# Patient Record
Sex: Female | Born: 1955 | Race: White | Hispanic: No | Marital: Married | Smoking: Former smoker
Health system: Southern US, Community
[De-identification: ages and names within clinical notes are randomized; demographics above are authoritative.]

## PROBLEM LIST (undated history)

## (undated) DIAGNOSIS — K589 Irritable bowel syndrome without diarrhea: Secondary | ICD-10-CM

## (undated) DIAGNOSIS — C801 Malignant (primary) neoplasm, unspecified: Secondary | ICD-10-CM

## (undated) DIAGNOSIS — E7800 Pure hypercholesterolemia, unspecified: Secondary | ICD-10-CM

## (undated) DIAGNOSIS — K219 Gastro-esophageal reflux disease without esophagitis: Secondary | ICD-10-CM

## (undated) DIAGNOSIS — M810 Age-related osteoporosis without current pathological fracture: Secondary | ICD-10-CM

## (undated) DIAGNOSIS — F411 Generalized anxiety disorder: Secondary | ICD-10-CM

## (undated) DIAGNOSIS — M8580 Other specified disorders of bone density and structure, unspecified site: Secondary | ICD-10-CM

## (undated) DIAGNOSIS — F419 Anxiety disorder, unspecified: Secondary | ICD-10-CM

## (undated) DIAGNOSIS — E041 Nontoxic single thyroid nodule: Secondary | ICD-10-CM

## (undated) DIAGNOSIS — Z803 Family history of malignant neoplasm of breast: Secondary | ICD-10-CM

## (undated) DIAGNOSIS — E538 Deficiency of other specified B group vitamins: Secondary | ICD-10-CM

## (undated) DIAGNOSIS — G4700 Insomnia, unspecified: Secondary | ICD-10-CM

## (undated) DIAGNOSIS — I341 Nonrheumatic mitral (valve) prolapse: Secondary | ICD-10-CM

## (undated) DIAGNOSIS — C50919 Malignant neoplasm of unspecified site of unspecified female breast: Secondary | ICD-10-CM

## (undated) DIAGNOSIS — Z923 Personal history of irradiation: Secondary | ICD-10-CM

## (undated) HISTORY — DX: Generalized anxiety disorder: F41.1

## (undated) HISTORY — DX: Deficiency of other specified B group vitamins: E53.8

## (undated) HISTORY — DX: Anxiety disorder, unspecified: F41.9

## (undated) HISTORY — PX: APPENDECTOMY: SHX54

## (undated) HISTORY — PX: KNEE CARTILAGE SURGERY: SHX688

## (undated) HISTORY — DX: Other specified disorders of bone density and structure, unspecified site: M85.80

## (undated) HISTORY — DX: Malignant (primary) neoplasm, unspecified: C80.1

## (undated) HISTORY — DX: Pure hypercholesterolemia, unspecified: E78.00

## (undated) HISTORY — DX: Family history of malignant neoplasm of breast: Z80.3

## (undated) HISTORY — DX: Nonrheumatic mitral (valve) prolapse: I34.1

## (undated) HISTORY — PX: FOOT SURGERY: SHX648

## (undated) HISTORY — DX: Irritable bowel syndrome, unspecified: K58.9

## (undated) HISTORY — DX: Gastro-esophageal reflux disease without esophagitis: K21.9

## (undated) HISTORY — DX: Age-related osteoporosis without current pathological fracture: M81.0

## (undated) HISTORY — DX: Insomnia, unspecified: G47.00

## (undated) HISTORY — DX: Nontoxic single thyroid nodule: E04.1

---

## 1998-04-10 ENCOUNTER — Other Ambulatory Visit: Admission: RE | Admit: 1998-04-10 | Discharge: 1998-04-10 | Payer: Self-pay | Admitting: Obstetrics and Gynecology

## 1998-09-08 ENCOUNTER — Emergency Department (HOSPITAL_COMMUNITY): Admission: EM | Admit: 1998-09-08 | Discharge: 1998-09-08 | Payer: Self-pay | Admitting: Emergency Medicine

## 1998-09-20 ENCOUNTER — Ambulatory Visit (HOSPITAL_COMMUNITY): Admission: RE | Admit: 1998-09-20 | Discharge: 1998-09-20 | Payer: Self-pay | Admitting: Internal Medicine

## 1998-09-20 ENCOUNTER — Encounter: Payer: Self-pay | Admitting: Internal Medicine

## 1998-09-22 ENCOUNTER — Ambulatory Visit (HOSPITAL_COMMUNITY): Admission: RE | Admit: 1998-09-22 | Discharge: 1998-09-22 | Payer: Self-pay | Admitting: Internal Medicine

## 1998-09-22 ENCOUNTER — Encounter: Payer: Self-pay | Admitting: Internal Medicine

## 1999-05-07 ENCOUNTER — Other Ambulatory Visit: Admission: RE | Admit: 1999-05-07 | Discharge: 1999-05-07 | Payer: Self-pay | Admitting: Obstetrics and Gynecology

## 1999-06-19 ENCOUNTER — Other Ambulatory Visit: Admission: RE | Admit: 1999-06-19 | Discharge: 1999-06-19 | Payer: Self-pay | Admitting: Obstetrics and Gynecology

## 1999-06-19 ENCOUNTER — Encounter (INDEPENDENT_AMBULATORY_CARE_PROVIDER_SITE_OTHER): Payer: Self-pay | Admitting: Specialist

## 1999-09-06 ENCOUNTER — Encounter: Payer: Self-pay | Admitting: Internal Medicine

## 1999-09-06 ENCOUNTER — Inpatient Hospital Stay (HOSPITAL_COMMUNITY): Admission: RE | Admit: 1999-09-06 | Discharge: 1999-09-08 | Payer: Self-pay | Admitting: Internal Medicine

## 1999-09-06 ENCOUNTER — Encounter (INDEPENDENT_AMBULATORY_CARE_PROVIDER_SITE_OTHER): Payer: Self-pay | Admitting: Specialist

## 2000-01-08 ENCOUNTER — Other Ambulatory Visit: Admission: RE | Admit: 2000-01-08 | Discharge: 2000-01-08 | Payer: Self-pay | Admitting: Obstetrics and Gynecology

## 2000-03-05 ENCOUNTER — Other Ambulatory Visit: Admission: RE | Admit: 2000-03-05 | Discharge: 2000-03-05 | Payer: Self-pay | Admitting: Obstetrics and Gynecology

## 2000-03-05 ENCOUNTER — Encounter (INDEPENDENT_AMBULATORY_CARE_PROVIDER_SITE_OTHER): Payer: Self-pay

## 2000-04-23 ENCOUNTER — Encounter: Payer: Self-pay | Admitting: Internal Medicine

## 2000-12-30 ENCOUNTER — Other Ambulatory Visit: Admission: RE | Admit: 2000-12-30 | Discharge: 2000-12-30 | Payer: Self-pay | Admitting: Obstetrics and Gynecology

## 2001-06-12 ENCOUNTER — Other Ambulatory Visit: Admission: RE | Admit: 2001-06-12 | Discharge: 2001-06-12 | Payer: Self-pay | Admitting: Obstetrics and Gynecology

## 2001-12-08 ENCOUNTER — Other Ambulatory Visit: Admission: RE | Admit: 2001-12-08 | Discharge: 2001-12-08 | Payer: Self-pay | Admitting: Obstetrics and Gynecology

## 2001-12-11 ENCOUNTER — Encounter: Admission: RE | Admit: 2001-12-11 | Discharge: 2001-12-11 | Payer: Self-pay | Admitting: Obstetrics and Gynecology

## 2001-12-11 ENCOUNTER — Encounter: Payer: Self-pay | Admitting: Obstetrics and Gynecology

## 2002-01-19 ENCOUNTER — Other Ambulatory Visit: Admission: RE | Admit: 2002-01-19 | Discharge: 2002-01-19 | Payer: Self-pay | Admitting: General Surgery

## 2002-07-20 ENCOUNTER — Other Ambulatory Visit: Admission: RE | Admit: 2002-07-20 | Discharge: 2002-07-20 | Payer: Self-pay | Admitting: Obstetrics and Gynecology

## 2003-02-07 ENCOUNTER — Other Ambulatory Visit: Admission: RE | Admit: 2003-02-07 | Discharge: 2003-02-07 | Payer: Self-pay | Admitting: Obstetrics and Gynecology

## 2003-07-27 ENCOUNTER — Other Ambulatory Visit: Admission: RE | Admit: 2003-07-27 | Discharge: 2003-07-27 | Payer: Self-pay | Admitting: Obstetrics and Gynecology

## 2004-01-03 ENCOUNTER — Emergency Department (HOSPITAL_COMMUNITY): Admission: EM | Admit: 2004-01-03 | Discharge: 2004-01-03 | Payer: Self-pay | Admitting: Emergency Medicine

## 2004-02-16 ENCOUNTER — Other Ambulatory Visit: Admission: RE | Admit: 2004-02-16 | Discharge: 2004-02-16 | Payer: Self-pay | Admitting: Obstetrics and Gynecology

## 2004-02-23 ENCOUNTER — Encounter: Admission: RE | Admit: 2004-02-23 | Discharge: 2004-02-23 | Payer: Self-pay | Admitting: Obstetrics and Gynecology

## 2004-04-03 ENCOUNTER — Encounter: Admission: RE | Admit: 2004-04-03 | Discharge: 2004-04-03 | Payer: Self-pay | Admitting: Obstetrics and Gynecology

## 2005-03-13 ENCOUNTER — Other Ambulatory Visit: Admission: RE | Admit: 2005-03-13 | Discharge: 2005-03-13 | Payer: Self-pay | Admitting: Obstetrics and Gynecology

## 2005-03-25 ENCOUNTER — Ambulatory Visit: Payer: Self-pay | Admitting: Internal Medicine

## 2005-06-06 ENCOUNTER — Ambulatory Visit: Payer: Self-pay | Admitting: Internal Medicine

## 2005-07-22 ENCOUNTER — Encounter (INDEPENDENT_AMBULATORY_CARE_PROVIDER_SITE_OTHER): Payer: Self-pay | Admitting: *Deleted

## 2005-07-22 ENCOUNTER — Ambulatory Visit: Payer: Self-pay | Admitting: Internal Medicine

## 2007-02-10 ENCOUNTER — Ambulatory Visit: Payer: Self-pay | Admitting: Internal Medicine

## 2007-02-10 DIAGNOSIS — F4321 Adjustment disorder with depressed mood: Secondary | ICD-10-CM

## 2007-02-10 DIAGNOSIS — F329 Major depressive disorder, single episode, unspecified: Secondary | ICD-10-CM

## 2007-02-10 DIAGNOSIS — F32A Depression, unspecified: Secondary | ICD-10-CM | POA: Insufficient documentation

## 2007-02-10 HISTORY — DX: Adjustment disorder with depressed mood: F43.21

## 2007-02-11 LAB — CONVERTED CEMR LAB: TSH: 1.74 microintl units/mL (ref 0.35–5.50)

## 2007-12-23 DIAGNOSIS — K219 Gastro-esophageal reflux disease without esophagitis: Secondary | ICD-10-CM

## 2007-12-23 DIAGNOSIS — I059 Rheumatic mitral valve disease, unspecified: Secondary | ICD-10-CM

## 2007-12-23 DIAGNOSIS — D126 Benign neoplasm of colon, unspecified: Secondary | ICD-10-CM

## 2007-12-23 HISTORY — DX: Benign neoplasm of colon, unspecified: D12.6

## 2007-12-23 HISTORY — DX: Rheumatic mitral valve disease, unspecified: I05.9

## 2007-12-23 HISTORY — DX: Gastro-esophageal reflux disease without esophagitis: K21.9

## 2007-12-24 ENCOUNTER — Ambulatory Visit: Payer: Self-pay | Admitting: Internal Medicine

## 2007-12-24 DIAGNOSIS — R1013 Epigastric pain: Secondary | ICD-10-CM | POA: Insufficient documentation

## 2007-12-25 ENCOUNTER — Ambulatory Visit (HOSPITAL_COMMUNITY): Admission: RE | Admit: 2007-12-25 | Discharge: 2007-12-25 | Payer: Self-pay | Admitting: Internal Medicine

## 2007-12-25 LAB — CONVERTED CEMR LAB
AST: 20 units/L (ref 0–37)
Albumin: 4.2 g/dL (ref 3.5–5.2)
Alkaline Phosphatase: 45 units/L (ref 39–117)
Amylase: 63 units/L (ref 27–131)
BUN: 8 mg/dL (ref 6–23)
Basophils Relative: 0.9 % (ref 0.0–3.0)
Creatinine, Ser: 0.9 mg/dL (ref 0.4–1.2)
Eosinophils Absolute: 0.1 10*3/uL (ref 0.0–0.7)
Eosinophils Relative: 0.6 % (ref 0.0–5.0)
GFR calc Af Amer: 85 mL/min
Glucose, Bld: 90 mg/dL (ref 70–99)
HCT: 39.8 % (ref 36.0–46.0)
Hemoglobin: 14.1 g/dL (ref 12.0–15.0)
MCV: 95 fL (ref 78.0–100.0)
Monocytes Absolute: 0.6 10*3/uL (ref 0.1–1.0)
Monocytes Relative: 6.8 % (ref 3.0–12.0)
Platelets: 219 10*3/uL (ref 150–400)
Potassium: 4.1 meq/L (ref 3.5–5.1)
RBC: 4.19 M/uL (ref 3.87–5.11)
Total Protein: 6.8 g/dL (ref 6.0–8.3)
WBC: 8.9 10*3/uL (ref 4.5–10.5)

## 2008-03-04 DIAGNOSIS — C50912 Malignant neoplasm of unspecified site of left female breast: Secondary | ICD-10-CM | POA: Insufficient documentation

## 2008-03-04 DIAGNOSIS — C801 Malignant (primary) neoplasm, unspecified: Secondary | ICD-10-CM

## 2008-03-04 DIAGNOSIS — C50919 Malignant neoplasm of unspecified site of unspecified female breast: Secondary | ICD-10-CM

## 2008-03-04 DIAGNOSIS — Z923 Personal history of irradiation: Secondary | ICD-10-CM

## 2008-03-04 HISTORY — DX: Malignant neoplasm of unspecified site of unspecified female breast: C50.919

## 2008-03-04 HISTORY — DX: Malignant (primary) neoplasm, unspecified: C80.1

## 2008-03-04 HISTORY — DX: Personal history of irradiation: Z92.3

## 2008-03-04 HISTORY — DX: Malignant neoplasm of unspecified site of left female breast: C50.912

## 2008-07-08 ENCOUNTER — Encounter (INDEPENDENT_AMBULATORY_CARE_PROVIDER_SITE_OTHER): Payer: Self-pay | Admitting: Obstetrics and Gynecology

## 2008-07-08 ENCOUNTER — Encounter: Admission: RE | Admit: 2008-07-08 | Discharge: 2008-07-08 | Payer: Self-pay | Admitting: Obstetrics and Gynecology

## 2008-07-08 ENCOUNTER — Encounter (INDEPENDENT_AMBULATORY_CARE_PROVIDER_SITE_OTHER): Payer: Self-pay | Admitting: Diagnostic Radiology

## 2008-07-08 HISTORY — PX: BREAST BIOPSY: SHX20

## 2008-07-15 ENCOUNTER — Encounter: Admission: RE | Admit: 2008-07-15 | Discharge: 2008-07-15 | Payer: Self-pay | Admitting: Obstetrics and Gynecology

## 2008-07-19 ENCOUNTER — Ambulatory Visit: Payer: Self-pay | Admitting: Genetic Counselor

## 2008-08-26 ENCOUNTER — Encounter: Admission: RE | Admit: 2008-08-26 | Discharge: 2008-08-26 | Payer: Self-pay | Admitting: General Surgery

## 2008-08-29 ENCOUNTER — Encounter (INDEPENDENT_AMBULATORY_CARE_PROVIDER_SITE_OTHER): Payer: Self-pay | Admitting: General Surgery

## 2008-08-29 ENCOUNTER — Ambulatory Visit (HOSPITAL_BASED_OUTPATIENT_CLINIC_OR_DEPARTMENT_OTHER): Admission: RE | Admit: 2008-08-29 | Discharge: 2008-08-30 | Payer: Self-pay | Admitting: General Surgery

## 2008-08-29 ENCOUNTER — Encounter: Admission: RE | Admit: 2008-08-29 | Discharge: 2008-08-29 | Payer: Self-pay | Admitting: General Surgery

## 2008-08-29 HISTORY — PX: BREAST LUMPECTOMY: SHX2

## 2008-08-29 HISTORY — PX: REDUCTION MAMMAPLASTY: SUR839

## 2008-09-13 ENCOUNTER — Ambulatory Visit: Admission: RE | Admit: 2008-09-13 | Discharge: 2008-11-24 | Payer: Self-pay | Admitting: Radiation Oncology

## 2008-09-15 ENCOUNTER — Encounter: Payer: Self-pay | Admitting: Internal Medicine

## 2008-09-15 ENCOUNTER — Ambulatory Visit: Payer: Self-pay | Admitting: Oncology

## 2008-11-15 ENCOUNTER — Ambulatory Visit: Payer: Self-pay | Admitting: Oncology

## 2009-06-16 ENCOUNTER — Ambulatory Visit: Payer: Self-pay | Admitting: Internal Medicine

## 2009-07-06 ENCOUNTER — Telehealth: Payer: Self-pay | Admitting: Internal Medicine

## 2009-07-06 ENCOUNTER — Emergency Department (HOSPITAL_COMMUNITY): Admission: EM | Admit: 2009-07-06 | Discharge: 2009-07-06 | Payer: Self-pay | Admitting: Emergency Medicine

## 2009-07-13 ENCOUNTER — Encounter: Admission: RE | Admit: 2009-07-13 | Discharge: 2009-07-13 | Payer: Self-pay | Admitting: Obstetrics and Gynecology

## 2009-09-13 ENCOUNTER — Telehealth (INDEPENDENT_AMBULATORY_CARE_PROVIDER_SITE_OTHER): Payer: Self-pay | Admitting: *Deleted

## 2010-04-03 NOTE — Progress Notes (Signed)
Summary: citalopram not helping//HOP SEE  Phone Note Call from Patient   Caller: Mom  ANN ADAMS Summary of Call: Seen 06/16/09 on Citalopram 20mg ; pt showing increased signs of depression ; --Pt mentioned to her son today "I dont feel like being on this planet anymore" --Mother concerned says I lost my sisiter to suicide.  RECOMMEND MOTHER TO TAKE PT TO ED, mother agreed will go to [facility] ED mother cell # is [phone] home # [phone] .Kandice Hams  Jul 06, 2009 4:04 PM   Initial call taken by: Kandice Hams,  Jul 06, 2009 4:04 PM  Follow-up for Phone Call        ACT Team evluation  is appropriate Follow-up by: Marga Melnick MD,  Jul 06, 2009 4:09 PM

## 2010-04-03 NOTE — Progress Notes (Signed)
Summary: NEEDS CITALOPRAM--NEEDS 90 DAY, NOT 30  Phone Note Call from Patient Call back at [facility] Phone [phone]   Caller: Patient Summary of Call: PATIENT NEEDS REFILL FOR CITALOPRAM ----SHE SAYS SHE WAS TOLD THAT, WHEN SHE CALLED FOR A REFILL, SHE COULD REQUEST A 90 DAY SUPPLY WITH REFILLS INSTEAD OF A 30 DAY SUPPLY  PLEASE CALL IT IN TO THE [facility] ON [address], [place] Initial call taken by: Jerolyn Shin,  September 13, 2009 11:38 AM    Prescriptions: CITALOPRAM HYDROBROMIDE 20 MG TABS (CITALOPRAM HYDROBROMIDE) 1 once daily  #90 x 1   Entered by:   Shonna Chock CMA   Authorized by:   Marga Melnick MD   Signed by:   Shonna Chock CMA on 09/13/2009   Method used:   Electronically to        [facility] (retail)       [address]       [place], [place]  [address]       Ph: [phone]       Fax: [phone]   RxID:   [id number]

## 2010-04-03 NOTE — Assessment & Plan Note (Signed)
Summary: Depression/scm   Vital Signs:  Patient profile:   55 year old female Weight:      117 pounds Temp:     98.1 degrees F oral Pulse rate:   76 / minute Resp:     15 per minute BP sitting:   102 / 72  (left arm)  Vitals Entered By: Jeremy Johann CMA (June 16, 2009 3:04 PM) CC: discuss depression Comments REVIEWED MED LIST, PATIENT AGREED DOSE AND INSTRUCTION CORRECT    Primary Care Provider:  Marga Melnick, MD  CC:  discuss depression.  History of Present Illness: She describes anxiety , sadness & anhedondia. Major stresses are son, breast cancer & menopause. She is seeing Colen Darling & had gone to [place].  Allergies (verified): No Known Drug Allergies  Review of Systems General:  Complains of fatigue and sleep disorder; denies loss of appetite and weight loss. Psych:  Complains of anxiety, depression, easily angered, easily tearful, irritability, and suicidal thoughts/plans; denies panic attacks; No concrete thought of suicide; "it crossed my mind".  Physical Exam  General:  Thin,well-nourished; alert,appropriate and cooperative throughout examination Eyes:  No corneal or conjunctival inflammation noted. Perrla.No lid lag Neck:  No deformities, masses, or tenderness noted. Heart:  Normal rate and regular rhythm. S1 and S2 normal without gallop, murmur, click, rub.S4 Neurologic:  alert & oriented X3 and DTRs symmetrical and normal.  No tremor Skin:  Intact without suspicious lesions or rashes Psych:  memory intact for recent and remote, normally interactive, and  slightful tearful.     Impression & Recommendations:  Problem # 1:  DEPRESSIVE DISORDER (ICD-311)  Orders: Venipuncture (16109) TLB-TSH (Thyroid Stimulating Hormone) (84443-TSH)  Her updated medication list for this problem includes:    Citalopram Hydrobromide 20 Mg Tabs (Citalopram hydrobromide) .Marland Kitchen... 1 once daily  Complete Medication List: 1)  Prilosec 20 Mg Cpdr (Omeprazole) .Marland Kitchen.. 1 capsule by  mouth once daily 2)  Calcium Carbonate-vitamin D 600-400 Mg-unit Tabs (Calcium carbonate-vitamin d) .Marland Kitchen.. 1 tablet by mouth two times a day 3)  Citalopram Hydrobromide 20 Mg Tabs (Citalopram hydrobromide) .Marland Kitchen.. 1 once daily  Patient Instructions: 1)  Please schedule a follow-up appointment in 2 months. Prescriptions: CITALOPRAM HYDROBROMIDE 20 MG TABS (CITALOPRAM HYDROBROMIDE) 1 once daily  #30 x 5   Entered and Authorized by:   Marga Melnick MD   Signed by:   Marga Melnick MD on 06/16/2009   Method used:   Print then Give to Patient   RxID:   [id number]

## 2010-05-22 LAB — TRICYCLICS SCREEN, URINE: TCA Scrn: NOT DETECTED

## 2010-05-22 LAB — RAPID URINE DRUG SCREEN, HOSP PERFORMED
Benzodiazepines: POSITIVE — AB
Cocaine: NOT DETECTED
Tetrahydrocannabinol: NOT DETECTED

## 2010-06-11 ENCOUNTER — Other Ambulatory Visit: Payer: Self-pay | Admitting: Internal Medicine

## 2010-06-11 LAB — CBC
MCHC: 33.9 g/dL (ref 30.0–36.0)
MCV: 95.9 fL (ref 78.0–100.0)
Platelets: 214 10*3/uL (ref 150–400)
RDW: 12.9 % (ref 11.5–15.5)

## 2010-06-11 LAB — DIFFERENTIAL
Eosinophils Relative: 0 % (ref 0–5)
Lymphocytes Relative: 25 % (ref 12–46)
Lymphs Abs: 1.4 10*3/uL (ref 0.7–4.0)

## 2010-06-11 LAB — COMPREHENSIVE METABOLIC PANEL
AST: 23 U/L (ref 0–37)
Albumin: 4.2 g/dL (ref 3.5–5.2)
CO2: 24 mEq/L (ref 19–32)
Calcium: 9.1 mg/dL (ref 8.4–10.5)
Creatinine, Ser: 0.8 mg/dL (ref 0.4–1.2)
GFR calc Af Amer: >60 mL/min (ref >60)
GFR calc non Af Amer: >60 mL/min (ref >60)
Total Protein: 6.7 g/dL (ref 6.0–8.3)

## 2010-06-11 LAB — URINALYSIS, ROUTINE W REFLEX MICROSCOPIC
Bilirubin Urine: NEGATIVE
Ketones, ur: NEGATIVE mg/dL
Nitrite: NEGATIVE
Protein, ur: NEGATIVE mg/dL
Urobilinogen, UA: 1 mg/dL (ref 0.0–1.0)

## 2010-06-11 LAB — LACTATE DEHYDROGENASE: LDH: 152 U/L (ref 94–250)

## 2010-07-13 ENCOUNTER — Encounter (INDEPENDENT_AMBULATORY_CARE_PROVIDER_SITE_OTHER): Payer: Self-pay | Admitting: General Surgery

## 2010-07-17 NOTE — Op Note (Signed)
NAME:  Danielle Chavez, Danielle Chavez NO.:  [id number]   MEDICAL RECORD NO.:  [id number]          PATIENT TYPE:  AMB   LOCATION:  DSC                          FACILITY:  [facility]   PHYSICIAN:  Loreta Ave, MD DATE OF BIRTH:  08-11-1955   DATE OF PROCEDURE:  08/29/2008  DATE OF DISCHARGE:                               OPERATIVE REPORT   PREOPERATIVE DIAGNOSIS:  Left-sided ductal carcinoma in situ.   POSTOPERATIVE DIAGNOSIS:  Left-sided ductal carcinoma in situ.   PROCEDURE PERFORMED:  Dr. Claud Kelp performed a left lumpectomy and  a left axillary sentinel lymph node biopsy.  Dr. Noelle Penner performed  bilateral, differential mastopexy.  Please note that Dr. Jacinto Halim  portion of the operation will be dictated elsewhere.   SURGEON:  Loreta Ave, MD   ASSISTANT:  None.   ESTIMATED BLOOD LOSS:  20 mL.   DRAIN:  None.   COMPLICATIONS:  None.   DISPOSITION:  Stable to the recovery room.   CLINICAL INDICATION:  Danielle Chavez is a 55 year old female with high-grade  ductal carcinoma in situ of the left breast.  She is an excellent  candidate for lumpectomy with postoperative radiation and sentinel lymph  node biopsy.  However, she is relatively thin and desires breast  symmetry at the end of her oncologic procedure.  For this reason, she  was considered for oncoplasty procedure namely differential mastopexy.  [name] is aware of the risks of surgery include but are not limited  to bleeding, infection, breast asymmetry, loss of nipple sensation,  scarring, and the need for more surgery and [name] desires to  proceed.   DESCRIPTION OF THE OPERATION:  The patient was brought to the operating  room and placed in the supine position on the operating room table.  Preoperatively, she had been marked in the standard fashion for  mastopexy.  Her clavicles and sternal notch were outlined as were the  breast meridian.  A heavy batwing excision was planned on the left  breast.  This was designed around her lumpectomy defect in the lower  outer quadrant of her left breast.  This ellipse was approximately 3 cm  x 8 cm with the long axis being from the edge of the nipple running  laterally and slightly inferiorly.  The other portion of the batwing was  designed as a crescent excision above the nipple to elevate the nipple  1.5 cm.  Near an inch of that was transferred to the opposite breast.  The resection was planned to be slightly larger on the right than on the  left because the right breast was larger preoperatively and she desires  correction for symmetry.  After smooth and routine induction of general  anesthesia with laryngeal mask airway, the patient's chest was prepped  and draped into a sterile field.  At this point, Dr. Derrell Lolling performed a  left lumpectomy and sentinel lymph node biopsy and these will be  dictated elsewhere.  While Dr. Derrell Lolling was working on the left  lumpectomy, I performed a similar excision of the right outer and lower  quadrant.  At the end of excision, Dr. Jacinto Halim excision was 84 g on the  left and mine was 89 g on the right.  The crescent of tissue above the  areola on the right was excised and de-epithelialized with a 15 blade.  Next, the margins of the resection on the right were undermined above  the pectoralis muscle.  Next, the breast tissue was brought together in  layers with 2-0 Vicryl sutures.  Next, skin on the right was closed with  interrupted 3-0 buried Monocryl sutures in the dermis and running 4-0  subcuticular Monocryl stitch.  Attention was then turned to the left  breast after Dr. Derrell Lolling had completed his excision and sentinel lymph  node biopsy.   The left breast pocket was irrigated and hemostasis was verified with  electrocautery.  The breast tissue was undermined both superiorly and  inferiorly from the resection above the pectoralis muscle.  The breast  tissue was brought together with interrupted 2-0  Vicryl sutures to  obliterate all dead space.  Next, the skin was closed with 3-0 buried  interrupted dermal sutures and a running 4-0 subcuticular Monocryl  stitch.  Sponge and needle counts reported as correct.  The patient was  extubated and transported to the recovery room in stable condition.      Loreta Ave, MD  Electronically Signed     CF/MEDQ  D:  08/29/2008  T:  08/30/2008  Job:  [id number]

## 2010-07-17 NOTE — Op Note (Signed)
NAME:  Danielle Chavez, CARBARY                  ACCOUNT NO.:  [id number]   MEDICAL RECORD NO.:  [id number]          PATIENT TYPE:  AMB   LOCATION:  DSC                          FACILITY:  [facility]   PHYSICIAN:  Angelia Mould. Derrell Lolling, M.D.DATE OF BIRTH:  1956-01-21   DATE OF PROCEDURE:  08/29/2008  DATE OF DISCHARGE:                               OPERATIVE REPORT   PREOPERATIVE DIAGNOSIS:  High-grade ductal carcinoma in situ, left  breast, lower outer quadrant.   POSTOPERATIVE DIAGNOSIS:  High-grade ductal carcinoma in situ, left  breast, lower outer quadrant.   OPERATION PERFORMED:  1. Inject blue dye, left breast.  2. Left partial mastectomy with needle localization.  3. Left axillary sentinel node mapping and biopsy.   SURGEON:  Angelia Mould. Derrell Lolling, MD   OPERATIVE INDICATIONS:  This is a 55 year old Caucasian female who has  had no prior breast problems.  Routine mammograms recently showed a  small focus of microcalcifications in the left breast at 4 the o'clock  position.  Stereotactic biopsy was performed and showed a small focus of  high-grade ductal carcinoma in situ.  She has had an MRI which shows a  10-mm area of ductal carcinoma in situ at the 3:30 position of the left  breast near the biopsy marker clip.  This was a solitary finding.  She  has had genetic counseling and genetic testing and all of her markers  were negative.  She was interested in breast conservation surgery  because her right breast is larger than her left.  She was concerned  about worsening of the asymmetry.  I involved Dr. Loreta Ave and  we discussed the plan of care to perform oncoplastic surgery on both  breasts.  This was proposed to the patient and she is in full agreement.  We elected to do sentinel node biopsy because of the high-grade features  and the lateral nature of the tumor.   OPERATIVE TECHNIQUE:  The patient underwent needle localization by Dr.  Cain Saupe and the wire localization was  very good.  She was  brought to [facility] and underwent injection of  radionuclide by the nuclear medicine technician.   Dr. Noelle Penner and I sat her up in the holding area and marked bilateral  hemi-batwing incisions basically with a superiorly-oriented crescent  above the nipples to raise the nipples and bilateral radial ellipses at  about 3:30 position.  On the left, this encompassed the wire insertion  site.  The patient was then taken to the operating room and underwent  general anesthesia.  Following alcohol prep, I injected 5 mL of blue dye  in the left retroareolar area.  This was 2 mL of methylene blue mixed  with 3 mL of saline.  We massaged the breast for 5 minutes.  We remarked  the breast and then prepped and draped the neck and breast and axilla  and chest wall bilaterally.  We identified the patient as correct  patient, correct procedure, and correct side for all of the surgery.  Marcaine 0.5% with epinephrine was used as local infiltration  anesthetic.   The first procedure that was performed was the left partial mastectomy.  I made an incision through the marked radial ellipse at the 3:30  position on the left side.  Dissection was carried down widely around  the localizing marker.  The specimen was removed and weighed and marked  with a 6-color margin marker kit.  This specimen was sent to Dr. Deboraha Sprang  at the [facility] [place] and she said that it looked very good  with the marker clip in the center of the specimen.  The left breast  specimen was then sent to pathology.  The left breast wound was  irrigated with saline.  Hemostasis was excellent and achieved with  electrocautery.  This wound was packed open.   At this point in time, Dr. Noelle Penner started working on the right breast to  perform the hemi-batwing reduction on the right and he will dictate that  separately.   I then used the NeoProbe to isolate the area of radioactivity in the  left  axilla.  A transverse incision was made in the left axilla  overlying this area just slightly above the hairline.  Dissection was  carried down through the subcutaneous tissue and through the  clavipectoral fascia.  I isolated 2 very hot, very blue lymph nodes and  sent them both for routine histology.  Hemostasis was excellent.  The  wound was irrigated with saline.  The clavipectoral fascia was closed  with interrupted sutures of 3-0 Vicryl and skin was closed with a  running subcuticular suture of 4-0 Monocryl.   At this point of the case, I scrubbed out to speak with the family.  Dr.  Noelle Penner will complete the mastopexies and will place the bandages.  At  this point, estimated blood loss was about 30 mL or less.  Complications  none.  Sponge, needle, and instrument counts were correct.      Angelia Mould. Derrell Lolling, M.D.  Electronically Signed     HMI/MEDQ  D:  08/29/2008  T:  08/30/2008  Job:  [id number]   cc:   Juluis Mire, M.D.  Daryl Eastern, M.D.  Loreta Ave, MD  Lurline Hare, MD

## 2010-07-20 NOTE — H&P (Signed)
[facility]  Patient:    Danielle Chavez, Danielle Chavez                 MRN: [id number] Adm. Date:  09/06/99 Attending:  Angelia Mould. Derrell Lolling, M.D. CC:         Wilhemina Bonito. Eda Keys., M.D. [facility]             Titus Dubin. Alwyn Ren, M.D. [facility]                         History and Physical  CHIEF COMPLAINT:  Abdominal pain, nausea, and vomiting.  HISTORY OF PRESENT ILLNESS:  This is a 55 year old white female who has a 1 1/2-year history of intermittent episodes of abdominal pain. A typical attack is characterized by onset of epigastric pain, followed by nausea and vomiting repeatedly. This will last about three days and then completely resolve. She will occasionally but not always have diarrhea. Denies fever, chills, or abdominal distention.  She had been evaluated by Titus Dubin. Alwyn Ren, M.D. on several occasions. Gallbladder ultrasound is negative. Oral cholecystogram is negative. CBC and complete metabolic panels have been negative.  A similar attack began this morning with epigastric pain, nausea, and vomiting. She saw Wilhemina Bonito. Eda Keys., M.D. A CBC this time showed a white count of 11,100 with a left shift. A complete metabolic panel, lipase, and urinalysis are normal. She was sent for a CT scan which shows a slightly thickened gastric antrum suggesting the possibility of gastritis or poor filling. The appendix is noted to be retrocecal with some soft tissue stranding around the appendix suggesting inflammation. There is no abscess or sign of perforation. I was asked to evaluate the patient because of her recurrent abdominal pain and abnormal CT scan findings.  PAST MEDICAL HISTORY:  She has had left knee surgery for cartilage removal in the past. She has a history of mitral valve prolapse and takes antibiotics when she gets dental work.  CURRENT MEDICATIONS: 1. Prevacid one tablet daily. 2. Xanax p.r.n.  DRUG ALLERGIES:  CODEINE and DEMEROL cause nausea.  FAMILY  HISTORY:  Mother living age 39 has asthma, hypertension, and thyroid problems. Father died age 19 of transfusion-related AIDS. He had upper GI bleed from peptic ulcer disease. He had hypertension. Sister living, has had a cholecystectomy.  SOCIAL HISTORY:  The patient is divorced. Has two children. Denies the use of tobacco and drinks alcohol rarely. She is self-employed and sells womens clothes.  PHYSICAL EXAMINATION:  GENERAL:  A pleasant, thin, white female in mild distress.  VITAL SIGNS:  Temperature 97.8, respiratory rate 16, heart rate 90, blood pressure 107/56.  HEENT:  Sclerae clear. Extraocular movements intact. Oropharynx clear.  NECK:  Supple, nontender. No mass. No adenopathy. No bruits.  LUNGS:  Clear to auscultation.  HEART:  Regular rate and rhythm without murmur. I do not hear a click.  ABDOMEN:  Soft, not distended. Bowel sounds are active. She does have some tenderness in the right lower quadrant which is more subjective than objective. She really has minimal guarding and certainly there is no rebound or mass. The epigastrium is nontender. The right upper quadrant is nontender.  RECTAL:  Deferred because she has been repeatedly heme negative in the past.  EXTREMITIES:  No edema. Good pulses.  NEUROLOGICAL:  Grossly within normal limits.  IMPRESSION: 1. Probable appendicitis. History, physical findings, and CT scan findings are    consistent with a low-grade smoldering retrocecal  appendicitis. Gastritis    or other gastric pathology is possible but less likely. 2. Mitral valve prolapse.  PLAN:  I feel that the most prudent course is to proceed with diagnostic laparoscopy and laparoscopic appendectomy. If this is negative, consider upper endoscopy tomorrow. I have discussed the indications and details of the surgery with the patient and her mother. Risks and complications were outlined, including but not limited to bleeding, infection, injury to  adjacent organs, conversion to open laparotomy, and other unforeseen complications. They seem to understand these issues well. At this time, all their questions are answered. They agree with this plan. We will take her to the operating room tonight for diagnostic laparoscopy. She will be given ampicillin and gentamicin to cover her due to her history of mitral valve prolapse.DD: 09/06/99 TD:  09/06/99 Job: [id number]

## 2010-07-20 NOTE — Discharge Summary (Signed)
[facility]  Patient:    Danielle Chavez, ENSEY                 MRN: [id number] Adm. Date:  [id number] Disc. Date: [id number] Attending:  Brandy Hale CC:         Wilhemina Bonito. Eda Keys., M.D. [facility]  Titus Dubin. Alwyn Ren, M.D. [facility]   Discharge Summary  FINAL DIAGNOSIS:  Acute appendicitis.  OPERATIONS PERFORMED:  Laparoscopic appendectomy.  HISTORY OF PRESENT ILLNESS:  This a 55 year old white female with a 1 1/2 year history of intermittent episodes of epigastric pain, nausea, and vomiting with complete resolution after 2 or 3 days.  She has had an extensive workup, including ultrasound, ______ cholecystogram and blood work which has been unremarkable.  She had the onset of a similar attack on the morning of admission.  She saw Dr. Wilhemina Bonito. Eda Keys. where a CBC showed a white blood cell count elevated slightly to 11,100.  Normal liver function test, normal lipase, and a normal urinalysis.  A CT scan was then obtained which showed a slightly thickened gastric antrum and a retrocecal appendix with some evidence of periappendiceal inflammation.  I was asked to evaluate her at this point.  PHYSICAL EXAMINATION:  GENERAL:  This is a pleasant white female in mild to moderate distress.  VITAL SIGNS:  Temperature 98.7, heart rate 90, blood pressure 107/56.  LUNGS:  Clear.  ABDOMEN:  Soft, tender in the right lower quadrant, but no rebound or mass. Nontender epigastrium.  HOSPITAL COURSE: Considering the history and physical findings and CT scan findings, I felt that the patient should be taken to the operating room for a laparoscopy.  That was done, and we found that she had an acutely inflamed appendix.  A laparoscopic appendectomy was performed.  We took a look at the liver, gallbladder, stomach, and duodenum and saw no abnormality.  Postoperatively, the patient did very well.  She progressed her diet and activities over the next 24-36 hours and was  discharged home on September 08, 1999.  CONDITION ON DISCHARGE: At that time, she was afebrile, tolerating diet, abdomen was soft, and wounds looked good.  FOLLOW-UP: She was asked to follow-up with me in the office in 7-10 days.  DISCHARGE MEDICATIONS: She was given a prescription for Vicodin for pain. DD:  10/02/99 TD:  10/02/99 Job: [id number]

## 2010-09-13 ENCOUNTER — Other Ambulatory Visit: Payer: Self-pay | Admitting: Internal Medicine

## 2010-10-31 ENCOUNTER — Ambulatory Visit (INDEPENDENT_AMBULATORY_CARE_PROVIDER_SITE_OTHER): Payer: BC Managed Care – PPO | Admitting: Internal Medicine

## 2010-10-31 ENCOUNTER — Encounter: Payer: Self-pay | Admitting: Internal Medicine

## 2010-10-31 VITALS — BP 112/74 | HR 76 | Ht 65.25 in | Wt 117.0 lb

## 2010-10-31 DIAGNOSIS — F329 Major depressive disorder, single episode, unspecified: Secondary | ICD-10-CM

## 2010-10-31 DIAGNOSIS — M8580 Other specified disorders of bone density and structure, unspecified site: Secondary | ICD-10-CM

## 2010-10-31 DIAGNOSIS — Z0000 Encounter for general adult medical examination without abnormal findings: Secondary | ICD-10-CM

## 2010-10-31 DIAGNOSIS — F3289 Other specified depressive episodes: Secondary | ICD-10-CM

## 2010-10-31 DIAGNOSIS — M899 Disorder of bone, unspecified: Secondary | ICD-10-CM

## 2010-10-31 DIAGNOSIS — M949 Disorder of cartilage, unspecified: Secondary | ICD-10-CM

## 2010-10-31 DIAGNOSIS — K219 Gastro-esophageal reflux disease without esophagitis: Secondary | ICD-10-CM

## 2010-10-31 LAB — HEPATIC FUNCTION PANEL
Albumin: 4.7 g/dL (ref 3.5–5.2)
Total Bilirubin: 0.5 mg/dL (ref 0.3–1.2)

## 2010-10-31 LAB — LIPID PANEL
HDL: 99.3 mg/dL (ref >39.00)
Total CHOL/HDL Ratio: 2
Triglycerides: 64 mg/dL (ref 0.0–149.0)
VLDL: 12.8 mg/dL (ref 0.0–40.0)

## 2010-10-31 LAB — BASIC METABOLIC PANEL
CO2: 30 mEq/L (ref 19–32)
Calcium: 9.1 mg/dL (ref 8.4–10.5)
Chloride: 102 mEq/L (ref 96–112)
Creatinine, Ser: 0.9 mg/dL (ref 0.4–1.2)
Sodium: 141 mEq/L (ref 135–145)

## 2010-10-31 LAB — CBC WITH DIFFERENTIAL/PLATELET
Basophils Relative: 0.5 % (ref 0.0–3.0)
Eosinophils Absolute: 0 10*3/uL (ref 0.0–0.7)
Eosinophils Relative: 0 % (ref 0.0–5.0)
Hemoglobin: 14.2 g/dL (ref 12.0–15.0)
Lymphocytes Relative: 30 % (ref 12.0–46.0)
MCHC: 33.1 g/dL (ref 30.0–36.0)
Neutro Abs: 2.8 10*3/uL (ref 1.4–7.7)
Neutrophils Relative %: 59.3 % (ref 43.0–77.0)
RBC: 4.38 Mil/uL (ref 3.87–5.11)
WBC: 4.7 10*3/uL (ref 4.5–10.5)

## 2010-10-31 LAB — TSH: TSH: 0.52 u[IU]/mL (ref 0.35–5.50)

## 2010-10-31 MED ORDER — CITALOPRAM HYDROBROMIDE 20 MG PO TABS: 20.0000 mg | ORAL_TABLET | Freq: Every day | ORAL | Status: DC

## 2010-10-31 NOTE — Progress Notes (Signed)
Subjective:    Patient ID: Danielle Chavez, female    DOB: 1955-05-11, 55 y.o.   MRN: [id number]  HPI  Danielle Chavez  is here for a physical; she denies acute issues.      Review of Systems Patient reports no vision/ hearing  changes, adenopathy,fever, weight change,  persistant / recurrent hoarseness , swallowing issues, chest pain,palpitations,edema,persistant /recurrent cough, hemoptysis, dyspnea( rest/ exertional/paroxysmal nocturnal), gastrointestinal bleeding(melena, rectal bleeding), abdominal pain, significant heartburn,  bowel changes,GU symptoms(dysuria, hematuria,pyuria, incontinence) ), Gyn symptoms(abnormal  bleeding , pain),  syncope, focal weakness, numbness & tingling, skin/hair /nail changes,abnormal bruising or bleeding.Citalopram effective for  depression.   Bone mineral density at her Gyn's  office has revealed mild osteopenia. She is on vitamin D .     Objective:   Physical Exam Gen.: Thin but healthy and well-nourished in appearance. Alert, appropriate and cooperative throughout exam. Head: Normocephalic without obvious abnormalities Eyes: No corneal or conjunctival inflammation noted. Pupils equal round reactive to light and accommodation. Fundal exam is benign without hemorrhages, exudate, papilledema. Extraocular motion intact. Vision grossly normal. Ears: External  ear exam reveals no significant lesions or deformities. Canals clear .TMs normal. Hearing is grossly normal bilaterally. Nose: External nasal exam reveals no deformity or inflammation. Nasal mucosa are pink and moist. No lesions or exudates noted.  Mouth: Oral mucosa and oropharynx reveal no lesions or exudates. Teeth in good repair. Neck: No deformities, masses, or tenderness noted. Range of motion &. Thyroid  normal. Lungs: Normal respiratory effort; chest expands symmetrically. Lungs are clear to auscultation without rales, wheezes, or increased work of breathing. Heart: Normal rate and rhythm. Normal S1 and  S2. No gallop, or rub. No click or murmur. Abdomen: Bowel sounds normal; abdomen soft and nontender. No masses, organomegaly or hernias noted. Aorta is palpable without enlargement or aneurysm. It is displaced to the right but then developed this(See possible scoliosis of spine) Genitalia: Dr Arelia Sneddon   .                                                                                   Musculoskeletal/extremities: Scoliosis suggested of   the thoracic  Spine based on asymmetry of thoracic muscles. No clubbing, cyanosis, edema, or deformity noted. Range of motion  normal .Tone & strength  normal.Joints normal. Nail health  good. Vascular: Carotid, radial artery, dorsalis pedis and  posterior tibial pulses are full and equal. No bruits present. Neurologic: Alert and oriented x3. Deep tendon reflexes symmetrical and normal.          Skin: Intact without suspicious lesions or rashes. Lymph: No cervical, axillary  lymphadenopathy present. Psych: Mood and affect are normal. Normally interactive  Assessment & Plan:  #1 comprehensive physical exam; no acute findings #2 see Problem List with Assessments & Recommendations Plan: see Orders

## 2010-10-31 NOTE — Patient Instructions (Addendum)
Preventive Health Care: Exercise  30-45  minutes a day, 3-4 days a week. Walking is especially valuable in preventing Osteoporosis. Eat a low-fat diet with lots of fruits and vegetables, up to 7-9 servings per day.Consume less than 30 grams of sugar per day from foods & drinks with High Fructose Corn Syrup as #1,2,3 or #4 on label. Depression is common in our stressful world. If you're feeling down or losing interest in things you normally enjoy, please call. The triggers for dyspepsia or "heart burn"  include stress; the "aspirin family" ; alcohol; peppermint; and caffeine (coffee, tea, cola, and chocolate). The aspirin family would include aspirin and the nonsteroidal agents such as ibuprofen &  Naproxen. Tylenol would not cause reflux. If having dyspepsia ; food & drink should be avoided for @ least 2 hours before going to bed.

## 2010-11-01 ENCOUNTER — Other Ambulatory Visit: Payer: Self-pay | Admitting: Obstetrics and Gynecology

## 2010-11-01 DIAGNOSIS — Z853 Personal history of malignant neoplasm of breast: Secondary | ICD-10-CM

## 2010-11-01 DIAGNOSIS — Z9889 Other specified postprocedural states: Secondary | ICD-10-CM

## 2010-11-07 ENCOUNTER — Encounter (INDEPENDENT_AMBULATORY_CARE_PROVIDER_SITE_OTHER): Payer: Self-pay | Admitting: General Surgery

## 2010-11-07 ENCOUNTER — Ambulatory Visit
Admission: RE | Admit: 2010-11-07 | Discharge: 2010-11-07 | Disposition: A | Discharge status: Home / Self Care | Payer: BC Managed Care – PPO | Source: Ambulatory Visit | Attending: Obstetrics and Gynecology | Admitting: Obstetrics and Gynecology

## 2010-11-07 ENCOUNTER — Ambulatory Visit (INDEPENDENT_AMBULATORY_CARE_PROVIDER_SITE_OTHER): Payer: BC Managed Care – PPO | Admitting: General Surgery

## 2010-11-07 VITALS — BP 112/76 | HR 68 | Temp 96.7°F | Ht 64.5 in | Wt 117.4 lb

## 2010-11-07 DIAGNOSIS — Z853 Personal history of malignant neoplasm of breast: Secondary | ICD-10-CM

## 2010-11-07 DIAGNOSIS — Z9889 Other specified postprocedural states: Secondary | ICD-10-CM

## 2010-11-07 NOTE — Progress Notes (Signed)
Chief Complaint  Patient presents with  . Breast Cancer Long Term Follow Up    HPI Danielle Chavez is a 55 y.o. female.    This patient underwent bilateral onco- plastic surgery with bilateral Hemi bat wing incisions, incorporating a left partial mastectomy with needle localization and a left axillary sentinel node biopsy. Date of surgery was August 29, 2008. 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 She had adjuvant radiation therapy with Dr. Michell Heinrich.  He has no known recurrence to date. She has been discharged from the care of Dr. Jethro Bolus. She sees Dr. Alwyn Ren from time to time and recently had a complete physical exam on October 31, 2010 which showed no new medical problems.  She had bilateral mammograms on November 07, 2010 which showed no focal abnormality, BI-RADS category 2.  She has no complaint about her breasts. HPI  Past Medical History  Diagnosis Date  . Cancer 2010    breast, Dr Derrell Lolling & Dr Michell Heinrich  . GERD (gastroesophageal reflux disease)   . Family history of breast cancer     MGM    Past Surgical History  Procedure Date  . Appendectomy   . Knee cartilage surgery     age15  . Breast lumpectomy 2010    LEFT; radiation    Family History  Problem Relation Age of Onset  . Bipolar disorder Maternal Aunt   . Asthma Mother   . Osteoarthritis Mother     Osteoporosis  . Ulcers Mother   . Hypertension Mother   . Hypertension Father   . Ulcers Father   . Other Father     AIDS  . Cancer Maternal Grandmother     breast    Social History History  Substance Use Topics  . Smoking status: Former Smoker    Quit date: 03/04/1982  . Smokeless tobacco: Never Used  . Alcohol Use: 3.0 oz/week    5 Shots of liquor per week    No Known Allergies  Current Outpatient Prescriptions  Medication Sig Dispense Refill  . citalopram (CELEXA) 20 MG tablet Take 1 tablet (20 mg total) by mouth daily.  90 tablet  3  .  Esomeprazole Magnesium (NEXIUM PO) Take by mouth daily. Patient unsure of dosage.       . Multiple Vitamin (MULTIVITAMIN) capsule Take 1 capsule by mouth daily.        . Omeprazole (PRILOSEC PO) Take by mouth.          Review of Systems Review of Systems 10 system review of systems is performed and is negative except as described above. Blood pressure 112/76, pulse 68, temperature 96.7 F (35.9 C), temperature source Temporal, height 5' 4.5" (1.638 m), weight 117 lb 6.4 oz (53.252 kg).  Physical Exam Physical Exam Constitutional the patient is thin, healthy, alert, and in no distress.  Neck no adenopathy no mass no jugular venous distention.  Lungs clear to auscultation.  Breasts bilateral backbleeding incisions have healed nicely. Symmetry is excellent. Nipple projection is excellent. Breasts are glandular but no palpable mass. No axillary adenopathy. Skin looks good. Data Reviewed I reviewed the mammograms. I reviewed Dr. Frederik Pear office notes.  Assessment    High-grade ductal carcinoma in situ, left breast, receptor negative. No evidence of recurrence 2 years following the above described surgery and adjuvant radiation therapy.     Plan    Return to see me in one year after she gets her annual mammograms  Danielle Chavez M 11/07/2010, 4:32 PM

## 2010-11-07 NOTE — Patient Instructions (Signed)
Her physical exam is normal and your mammograms look good. There is no sign of any recurrent breast cancer. I will see you in one year after you get your annual mammograms.

## 2010-12-20 ENCOUNTER — Ambulatory Visit
Admission: RE | Admit: 2010-12-20 | Discharge: 2010-12-20 | Disposition: A | Discharge status: Home / Self Care | Payer: BC Managed Care – PPO | Source: Ambulatory Visit | Attending: Radiation Oncology | Admitting: Radiation Oncology

## 2010-12-20 ENCOUNTER — Other Ambulatory Visit: Payer: Self-pay | Admitting: Internal Medicine

## 2010-12-21 NOTE — Telephone Encounter (Signed)
RX called in, did not go through

## 2011-01-04 ENCOUNTER — Encounter: Payer: BC Managed Care – PPO | Admitting: Genetic Counselor

## 2011-10-10 ENCOUNTER — Other Ambulatory Visit (INDEPENDENT_AMBULATORY_CARE_PROVIDER_SITE_OTHER): Payer: Self-pay | Admitting: General Surgery

## 2011-10-10 DIAGNOSIS — Z1231 Encounter for screening mammogram for malignant neoplasm of breast: Secondary | ICD-10-CM

## 2011-11-11 ENCOUNTER — Ambulatory Visit
Admission: RE | Admit: 2011-11-11 | Discharge: 2011-11-11 | Disposition: A | Discharge status: Home / Self Care | Payer: BC Managed Care – PPO | Source: Ambulatory Visit | Attending: General Surgery | Admitting: General Surgery

## 2011-11-11 DIAGNOSIS — Z1231 Encounter for screening mammogram for malignant neoplasm of breast: Secondary | ICD-10-CM

## 2011-11-21 ENCOUNTER — Ambulatory Visit (INDEPENDENT_AMBULATORY_CARE_PROVIDER_SITE_OTHER): Payer: BC Managed Care – PPO | Admitting: General Surgery

## 2011-12-22 ENCOUNTER — Other Ambulatory Visit: Payer: Self-pay | Admitting: Internal Medicine

## 2011-12-23 NOTE — Telephone Encounter (Signed)
Plasma concentrations and toxic effects of citalopram may be increased by concomitant administration of NEXIUM PO, PRILOSEC PO. Specifically, citalopram doses greater than 20 mg/day are not recommended in patients receiving NEXIUM PO, PRILOSEC PO according to official package labeling due to the risk of QT prolongation.   Dr.Hopper please advise, I spoke with patient for she has 2 medications for Acid Reflux on her medication list Nexium and Omeprazole. Patient state she is not taking Nexium for it was too expensive but she is taking Omeprazole.  Please advise

## 2011-12-23 NOTE — Telephone Encounter (Signed)
OK  # 30 (not 90) but additional refills require office visit

## 2011-12-24 NOTE — Telephone Encounter (Signed)
PATIENT NEED TO SCHEDULE A MEDICATION MANAGEMENT APPOINTMENT. CPX CAN BE SCHEDULED IN NEAR FUTURE

## 2011-12-25 ENCOUNTER — Encounter (INDEPENDENT_AMBULATORY_CARE_PROVIDER_SITE_OTHER): Payer: Self-pay | Admitting: General Surgery

## 2011-12-25 ENCOUNTER — Ambulatory Visit (INDEPENDENT_AMBULATORY_CARE_PROVIDER_SITE_OTHER): Payer: BC Managed Care – PPO | Admitting: General Surgery

## 2011-12-25 VITALS — BP 114/70 | HR 68 | Temp 98.1°F | Resp 14 | Ht 64.5 in | Wt 114.2 lb

## 2011-12-25 DIAGNOSIS — C50919 Malignant neoplasm of unspecified site of unspecified female breast: Secondary | ICD-10-CM

## 2011-12-25 DIAGNOSIS — C50912 Malignant neoplasm of unspecified site of left female breast: Secondary | ICD-10-CM

## 2011-12-25 NOTE — Progress Notes (Signed)
Patient ID: Danielle Chavez, female   DOB: Dec 29, 1955, 56 y.o.   MRN: [id number]  Chief Complaint  Patient presents with  . Breast Cancer Long Term Follow Up    HPI Danielle Chavez is a 56 y.o. female.  She returns for long-term followup regarding her left breast cancer.  On 08/29/2008 this patient underwent left partial mastectomy, bilateral Hemi- bat wing oncoplastic  surgery. Final pathology showed high-grade ductal carcinoma in situ of the left breast, receptor negative. She had adjuvant radiation therapy with Dr. Johnella Moloney. She was followed by Dr. Gaylyn Rong who has now discharged her from his care.  She sees Dr. Arelia Sneddon annually  and gets her breast exam there.  Recent mammograms at the Breast center of [facility] on 11/11/2011 her category 2, lumpectomy changes noted, no focal abnormality.  Of note, her sister developed a large area of DCIS in her left breast and in 2012 underwent bilateral mastectomy. They both had BRCA testing done and both were negative. The only other family members with breast cancer was a maternal grandmother.  The patient states that her breasts feel lumpy but unchanged and stable. She has no complaints of pain or deformity. HPI  Past Medical History  Diagnosis Date  . Cancer 2010    breast, Dr Derrell Lolling & Dr Michell Heinrich  . GERD (gastroesophageal reflux disease)   . Family history of breast cancer     MGM    Past Surgical History  Procedure Date  . Appendectomy   . Knee cartilage surgery     age15  . Breast lumpectomy 2010    LEFT; radiation    Family History  Problem Relation Age of Onset  . Bipolar disorder Maternal Aunt   . Asthma Mother   . Osteoarthritis Mother     Osteoporosis  . Ulcers Mother   . Hypertension Mother   . Hypertension Father   . Ulcers Father   . Other Father     AIDS    Social History History  Substance Use Topics  . Smoking status: Former Smoker    Quit date: 03/04/1982  . Smokeless tobacco: Never Used  . Alcohol Use: 3.0  oz/week    5 Shots of liquor per week    No Known Allergies  Current Outpatient Prescriptions  Medication Sig Dispense Refill  . ALPRAZolam (XANAX) 0.5 MG tablet as needed.      . citalopram (CELEXA) 20 MG tablet TAKE ONE TABLET BY MOUTH ONE TIME DAILY  90 tablet  1  . Multiple Vitamin (MULTIVITAMIN) capsule Take 1 capsule by mouth daily.        . Omeprazole (PRILOSEC PO) Take by mouth.        . DISCONTD: citalopram (CELEXA) 20 MG tablet TAKE ONE TABLET BY MOUTH ONE TIME DAILY  30 tablet  0    Review of Systems Review of Systems  Constitutional: Negative for fever, chills and unexpected weight change.  HENT: Negative for hearing loss, congestion, sore throat, trouble swallowing and voice change.   Eyes: Negative for visual disturbance.  Respiratory: Negative for cough and wheezing.   Cardiovascular: Negative for chest pain, palpitations and leg swelling.  Gastrointestinal: Negative for nausea, vomiting, abdominal pain, diarrhea, constipation, blood in stool, abdominal distention and anal bleeding.  Genitourinary: Negative for hematuria, vaginal bleeding and difficulty urinating.  Musculoskeletal: Negative for arthralgias.  Skin: Negative for rash and wound.  Neurological: Negative for seizures, syncope and headaches.  Hematological: Negative for adenopathy. Does not bruise/bleed easily.  Psychiatric/Behavioral: Negative  for confusion.    Blood pressure 114/70, pulse 68, temperature 98.1 F (36.7 C), temperature source Temporal, resp. rate 14, height 5' 4.5" (1.638 m), weight 114 lb 4 oz (51.823 kg).  Physical Exam Physical Exam  Constitutional: She is oriented to person, place, and time. She appears well-developed and well-nourished. No distress.  HENT:  Head: Normocephalic and atraumatic.  Mouth/Throat: No oropharyngeal exudate.  Neck: Neck supple. No JVD present. No tracheal deviation present. No thyromegaly present.  Cardiovascular: Normal rate, regular rhythm, normal  heart sounds and intact distal pulses.   No murmur heard. Pulmonary/Chest: Effort normal and breath sounds normal. No respiratory distress. She has no wheezes. She has no rales. She exhibits no tenderness.       Bilateral transverse breast incisions well healed. Breasts slightly diffusely lumpy. Contour and nipple projection good. Symmetry excellent. Cosmesis good. No axillary adenopathy. No dominant breast mass.  Lymphadenopathy:    She has no cervical adenopathy.  Neurological: She is alert and oriented to person, place, and time. She exhibits normal muscle tone. Coordination normal.  Skin: Skin is warm. No rash noted. She is not diaphoretic. No erythema. No pallor.  Psychiatric: She has a normal mood and affect. Her behavior is normal. Judgment and thought content normal.    Data Reviewed My office records and surgical records. Recent mammograms.  Assessment    Receptor negative, high-grade DCIS left breast. No evidence of local recurrence 3 years following left partial mastectomy, bilateral hemi-backbleeding symmetry procedures  Family history breast cancer in sister and maternal grandmother.  BRCA testing negative    Plan    Repeat bilateral mammograms in September 2014  Return to see me in one year.       Angelia Mould. Derrell Lolling, M.D., [facility] General and Minimally invasive Surgery Breast and Colorectal Surgery Office:   [phone] Pager:   [phone]  12/25/2011, 3:29 PM

## 2011-12-25 NOTE — Patient Instructions (Signed)
Your breast exam is normal bilaterally. There is no clinical evidence of cancer. Your recent mammograms are also normal.  Be sure to get your mammograms next September , 2014.  Return to see Dr. Derrell Lolling in one year for a checkup.

## 2012-03-12 ENCOUNTER — Other Ambulatory Visit: Payer: Self-pay | Admitting: Internal Medicine

## 2012-05-29 ENCOUNTER — Other Ambulatory Visit: Payer: Self-pay | Admitting: Internal Medicine

## 2012-05-29 NOTE — Telephone Encounter (Signed)
Med Management APPT

## 2012-06-11 ENCOUNTER — Ambulatory Visit: Payer: BC Managed Care – PPO | Admitting: Internal Medicine

## 2012-06-16 ENCOUNTER — Encounter: Payer: Self-pay | Admitting: Internal Medicine

## 2012-06-16 ENCOUNTER — Ambulatory Visit (INDEPENDENT_AMBULATORY_CARE_PROVIDER_SITE_OTHER): Payer: BC Managed Care – PPO | Admitting: Internal Medicine

## 2012-06-16 VITALS — BP 112/76 | HR 77 | Wt 116.0 lb

## 2012-06-16 DIAGNOSIS — R946 Abnormal results of thyroid function studies: Secondary | ICD-10-CM

## 2012-06-16 DIAGNOSIS — F329 Major depressive disorder, single episode, unspecified: Secondary | ICD-10-CM

## 2012-06-16 DIAGNOSIS — D126 Benign neoplasm of colon, unspecified: Secondary | ICD-10-CM

## 2012-06-16 MED ORDER — CITALOPRAM HYDROBROMIDE 20 MG PO TABS: ORAL_TABLET | ORAL | Status: DC

## 2012-06-16 NOTE — Progress Notes (Signed)
  Subjective:    Patient ID: Danielle Chavez, female    DOB: 1955-04-02, 57 y.o.   MRN: [id number]  HPI  She's been on citalopram for at least 4 years with significant benefit. The issues began with behavioral pattern her son in the context of her breast cancer 2010. She denies any significant adverse effects with this medication. She has no associated anhedonia, significant anxiety or panic attacks.  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   Review of Systems  She has some sleep dysfunction which she relates to menopause. Significant fatigue is  dernied ;change in weight of  3-5 pound gain with decreased CVE. No associated weakness, tremor , memory loss,  numbness or tingling, or  vertigo . Blurred vision, double vision vision, or vision loss. Some temperature intolerance to heat. No hoarseness present. Significant change in skin hair or nails have not been documented Specifically there has been no constellation of headache, chest pain, flushing, and diarrhea Constitutional: Nocturnal sweats with menopause Cardiovascular: No chest pain,  palpitations , racing, or irregular heart rhythm  GI: Rare dysphagia. PPI controls  dyspepsia . No constipation,  diarrhea , abdominal pain        Objective:   Physical Exam Gen.: Thin but healthy and well-nourished in appearance. Alert, appropriate and cooperative throughout exam.Appears younger than stated age  Head: Normocephalic without obvious abnormalities  Eyes: No corneal or conjunctival inflammation noted. No lid lag or prptosis Neck: No deformities, masses, or tenderness noted.  Thyroid normal. Lungs: Normal respiratory effort; chest expands symmetrically. Lungs are clear to auscultation without rales, wheezes, or increased work of breathing. Heart: Normal rate and rhythm. Normal S1 and S2. No gallop, click, or rub. No murmur. Abdomen: Bowel sounds normal; abdomen soft and nontender. No masses, organomegaly or hernias noted.                      Musculoskeletal/extremities: There is some asymmetry of the posterior thoracic musculature suggesting occult scoliosis. No clubbing, cyanosis, edema, or significant extremity  deformity noted. Range of motion normal .Tone & strength  Normal. Joints normal. Nail health good.No onycholysis Able to lie down & sit up w/o help. Negative SLR bilaterally Vascular: Carotid, radial artery, dorsalis pedis and  posterior tibial pulses are full and equal. No bruits present. Neurologic: Alert and oriented x3. Deep tendon reflexes symmetrical and normal.  Fine hand tremor .        Skin: Intact without suspicious lesions or rashes. Lymph: No cervical, axillary lymphadenopathy present. Psych: Mood and affect are normal. Normally interactive                                                                                        Assessment & Plan:    #1 depression, situational appropriate. Excellent response to citalopram without adverse effect  #2 low normal TSH  Plan: See orders and recommendations

## 2012-06-16 NOTE — Patient Instructions (Addendum)

## 2012-06-19 ENCOUNTER — Encounter: Payer: Self-pay | Admitting: *Deleted

## 2012-12-01 ENCOUNTER — Other Ambulatory Visit (INDEPENDENT_AMBULATORY_CARE_PROVIDER_SITE_OTHER): Payer: Self-pay | Admitting: General Surgery

## 2012-12-01 DIAGNOSIS — Z853 Personal history of malignant neoplasm of breast: Secondary | ICD-10-CM

## 2012-12-14 ENCOUNTER — Ambulatory Visit
Admission: RE | Admit: 2012-12-14 | Discharge: 2012-12-14 | Disposition: A | Discharge status: Home / Self Care | Payer: No Typology Code available for payment source | Source: Ambulatory Visit | Attending: General Surgery | Admitting: General Surgery

## 2012-12-14 DIAGNOSIS — Z853 Personal history of malignant neoplasm of breast: Secondary | ICD-10-CM

## 2012-12-28 ENCOUNTER — Encounter (INDEPENDENT_AMBULATORY_CARE_PROVIDER_SITE_OTHER): Payer: Self-pay | Admitting: General Surgery

## 2012-12-28 ENCOUNTER — Ambulatory Visit (INDEPENDENT_AMBULATORY_CARE_PROVIDER_SITE_OTHER): Payer: BC Managed Care – PPO | Admitting: General Surgery

## 2012-12-28 VITALS — BP 104/62 | HR 68 | Temp 97.6°F | Resp 14 | Ht 65.0 in | Wt 121.4 lb

## 2012-12-28 DIAGNOSIS — C50912 Malignant neoplasm of unspecified site of left female breast: Secondary | ICD-10-CM

## 2012-12-28 DIAGNOSIS — C50919 Malignant neoplasm of unspecified site of unspecified female breast: Secondary | ICD-10-CM

## 2012-12-28 NOTE — Patient Instructions (Signed)
Your  breast exam, lymph node exam, and recent mammograms are normal. There is no focal area of concern. There is no evidence of cancer.  We have talked about different options for surveillance and followup, and you have decided that you will follow up with Dr. Richardean Chimera on an annual basis, and that you will get annual mammograms.  Return to see Dr. Derrell Lolling as needed.

## 2012-12-28 NOTE — Progress Notes (Signed)
Patient ID: Danielle Chavez, female   DOB: April 14, 1955, 57 y.o.   MRN: [id number]  History: She returns for long-term followup regarding her left breast cancer.  On 08/29/2008 this patient underwent left partial mastectomy, bilateral Hemi- bat wing oncoplastic surgery. Final pathology showed high-grade ductal carcinoma in situ of the left breast, receptor negative. She had adjuvant radiation therapy with Dr. Johnella Moloney. She was followed by Dr. Gaylyn Rong who has now discharged her from his care.  She sees Dr. Arelia Sneddon annually and gets her breast exam there.  Recent mammograms at the Breast center of [facility] on 12/14/1912 are category 2, lumpectomy changes noted, no focal abnormality.  Of note, her sister developed a large area of DCIS in her left breast and in 2012 underwent bilateral mastectomy. They both had BRCA testing done and both were negative. The only other family members with breast cancer was a maternal grandmother.  The patient states that her breasts feel lumpy but unchanged and stable. She has no complaints of pain or deformity.  ROS: Patient is healthy. 10 system review of systems is performed and is negative except as above  Exam: Patient looks well. Healthy active for her age Neck no adenopathy or mass Lungs clear to auscultation bilaterally Heart regular rate and rhythm. No murmur. No ectopy Breasts:   Bilateral transverse breast incisions well healed. Breasts slightly diffusely lumpy. Contour and nipple projection good. Symmetry excellent. Cosmesis good. No axillary adenopathy. No dominant breast mass.    Assessment  Receptor negative, high-grade DCIS left breast. No evidence of local recurrence 4 years following left partial mastectomy, bilateral hemi-batwing symmetry procedures  Family history breast cancer in sister and maternal grandmother.  BRCA testing negative   Plan  Repeat bilateral mammograms in one year and annually thereafter Annual clinical breast exam by physician. We  discussed this in detail and she stated that she would continue to see Dr. Arelia Sneddon and Dr. Alwyn Ren, and that she would return to see me if new problems arise. I told her that was a good plan.  She will return to see me  as needed.    Angelia Mould. Derrell Lolling, M.D., [facility] General and Minimally invasive Surgery Breast and Colorectal Surgery Office:   [phone] Pager:   [phone]

## 2013-01-01 ENCOUNTER — Other Ambulatory Visit: Payer: Self-pay | Admitting: Obstetrics and Gynecology

## 2013-01-01 DIAGNOSIS — N63 Unspecified lump in unspecified breast: Secondary | ICD-10-CM

## 2013-01-18 ENCOUNTER — Other Ambulatory Visit: Payer: Self-pay

## 2013-02-03 ENCOUNTER — Other Ambulatory Visit: Payer: Self-pay

## 2013-02-23 ENCOUNTER — Ambulatory Visit
Admission: RE | Admit: 2013-02-23 | Discharge: 2013-02-23 | Disposition: A | Discharge status: Home / Self Care | Payer: No Typology Code available for payment source | Source: Ambulatory Visit | Attending: Obstetrics and Gynecology | Admitting: Obstetrics and Gynecology

## 2013-02-23 DIAGNOSIS — N63 Unspecified lump in unspecified breast: Secondary | ICD-10-CM

## 2013-03-18 ENCOUNTER — Encounter (INDEPENDENT_AMBULATORY_CARE_PROVIDER_SITE_OTHER): Payer: Self-pay

## 2013-03-18 ENCOUNTER — Encounter (INDEPENDENT_AMBULATORY_CARE_PROVIDER_SITE_OTHER): Payer: Self-pay | Admitting: General Surgery

## 2013-03-18 ENCOUNTER — Ambulatory Visit (INDEPENDENT_AMBULATORY_CARE_PROVIDER_SITE_OTHER): Payer: BC Managed Care – PPO | Admitting: General Surgery

## 2013-03-18 VITALS — BP 122/72 | HR 80 | Temp 97.4°F | Resp 18 | Ht 64.0 in | Wt 117.0 lb

## 2013-03-18 DIAGNOSIS — C50919 Malignant neoplasm of unspecified site of unspecified female breast: Secondary | ICD-10-CM

## 2013-03-18 DIAGNOSIS — N6310 Unspecified lump in the right breast, unspecified quadrant: Secondary | ICD-10-CM

## 2013-03-18 DIAGNOSIS — N63 Unspecified lump in unspecified breast: Secondary | ICD-10-CM

## 2013-03-18 DIAGNOSIS — C50912 Malignant neoplasm of unspecified site of left female breast: Secondary | ICD-10-CM

## 2013-03-18 NOTE — Progress Notes (Addendum)
Patient ID: Danielle Chavez, female   DOB: 01/23/1956, 57 y.o.   MRN: [id number] History: Danielle Chavez is a 58 y.o. female. She returns for evaluation of possible right breast mass, possible change in physical exam. On 08/29/2008 this patient underwent left partial mastectomy, bilateral Hemi- bat wing oncoplastic surgery. Final pathology showed high-grade ductal carcinoma in situ of the left breast, receptor negative. She had adjuvant radiation therapy with Dr. Wenttworth. She was followed by Dr. Ha who has now discharged her from his care.  She sees Dr. McComb annually and gets her breast exam there.   Of note, her sister developed a large area of DCIS in her left breast and in 2012 underwent bilateral mastectomy. They both had BRCA testing done and both were negative. The only other family members with breast cancer was a maternal grandmother.  The patient states that Dr. McCowan felt that there was an increased density and mass in the upper outer quadrant of the right breast. Imaging studies were performed including mammogram and ultrasound. There was no mammographic or sonographic evidence for malignancy in the area of palpable concern. The breast showed increased density, referred to as extremely dense. She has a little bit of tenderness in the breast but no pain no nipple discharge or skin change. Her health has remained stable.  Past history, family history, social history, and review of systems are documented on the chart, unchanged, and noncontributory except as described above  Exam: Patient is alert. No distress. Appears healthy Neck no adenopathy or mass. Lungs clear to auscultation bilaterally Heart regular rate and rhythm. No murmur. No ectopy Breast show bilateral transverse breast incision is extending  circumareolar area and laterally. There is increased density and lumpiness in the upper outer quadrant bilaterally, possibly slightly greater on the right than on the left. No skin  change. Symmetry excellent. No axillary adenopathy. There is no discrete dominant breast mass.  Assessment: Question of right breast mass, upper outer quadrant. It is not clear whether this is pathologic or simply dense glandular breast tissue High-risk patient due to personal  history   of breast cancer, family history of breast cancer, extremely dense breast, and possible change in physical exam Receptor negative, high-grade DCIS left breast. No evidence of local recurrence 3.5  years following left partial mastectomy, bilateral hemi-batwing symmetry procedures  Family history breast cancer in sister and maternal grandmother.  BRCA testing negative  Plan: We talked about numerous options. We talked about close clinical followup and radiographic followup with physical exam mammogram and ultrasound. We talked about breast MRI and I discussed the strengths and weaknesses  of that imaging modality . We talked about incisional biopsy, and the advantages and disadvantages of that. Open biopsy at this point in time is somewhat imprecise.  I have advocated an MRI as the next step, and holding off on biopsy for now. That remains an option. She is comfortable with this MRI Shows no suspicious or focal findings. This was communicated to the patient by phone. She is pleased and declines further followup in the office.    Haywood M. Ingram, M.D., FACS [facility] General and Minimally invasive Surgery Breast and Colorectal Surgery Office:   [phone] Pager:   [phone]   

## 2013-03-18 NOTE — Patient Instructions (Signed)
We have examined your breasts and reviewed your mammograms and ultrasound. You have very dense breasts radiographically, and so they are difficult to evaluate.  On physical exam I feel a somewhat diffuse area of thickening in the upper outer quadrant of the right breast, and to a lesser extent, a similar area in the left breast. Hopefully this is benign.  We have talked about numerous options. Because of your history of breast cancer, family history of breast cancer, high breast density, and possible change in physical exam, you'll be scheduled for bilateral breast MRI. I would like to hold off on a biopsy until this is done.  Return to see Dr. Dalbert Batman after the MRI is performed.

## 2013-03-24 ENCOUNTER — Inpatient Hospital Stay: Admission: RE | Admit: 2013-03-24 | Payer: Self-pay | Source: Ambulatory Visit

## 2013-03-24 ENCOUNTER — Telehealth: Payer: Self-pay | Admitting: Diagnostic Radiology

## 2013-03-26 ENCOUNTER — Telehealth (INDEPENDENT_AMBULATORY_CARE_PROVIDER_SITE_OTHER): Payer: Self-pay

## 2013-03-26 NOTE — Telephone Encounter (Signed)
Message copied by Gweneth Fritter on Fri Mar 26, 2013  8:36 AM ------      Message from: [place], [place]: Thu Mar 25, 2013  9:56 AM      Regarding: Confirmation on orders needed      Contact: [phone]       Pt seemed a little confused regarding her medical orders from [place]? ------

## 2013-03-26 NOTE — Telephone Encounter (Signed)
Left message for pt to call.  She is supposed to have a MRI and then see Dr Dalbert Batman back.  Other than that, I am not sure what she is referring to.

## 2013-03-27 ENCOUNTER — Inpatient Hospital Stay: Admission: RE | Admit: 2013-03-27 | Payer: Self-pay | Source: Ambulatory Visit

## 2013-03-31 ENCOUNTER — Ambulatory Visit
Admission: RE | Admit: 2013-03-31 | Discharge: 2013-03-31 | Disposition: A | Discharge status: Home / Self Care | Payer: BC Managed Care – PPO | Source: Ambulatory Visit | Attending: General Surgery | Admitting: General Surgery

## 2013-03-31 DIAGNOSIS — N6310 Unspecified lump in the right breast, unspecified quadrant: Secondary | ICD-10-CM

## 2013-03-31 DIAGNOSIS — C50912 Malignant neoplasm of unspecified site of left female breast: Secondary | ICD-10-CM

## 2013-03-31 MED ORDER — GADOBENATE DIMEGLUMINE 529 MG/ML IV SOLN
10.0000 mL | Freq: Once | INTRAVENOUS | Status: AC | PRN
  Administered 2013-03-31: 10 mL via INTRAVENOUS

## 2013-04-01 ENCOUNTER — Telehealth (INDEPENDENT_AMBULATORY_CARE_PROVIDER_SITE_OTHER): Payer: Self-pay

## 2013-04-01 NOTE — Telephone Encounter (Signed)
Message copied by Gweneth Fritter on Thu Apr 01, 2013  1:33 PM ------      Message from: Adin Hector      Created: Thu Apr 01, 2013 11:50 AM       Call radiology reports to patient.There are no suspicious findings on MRI. I will be happy to discuss this with her at her next office visit if desired.                  hmi       ------

## 2013-04-01 NOTE — Telephone Encounter (Signed)
I called the pt and let her know the MRI results.  She is pleased.  She decided she didn't need to come in for the visit and will call if she needs [place].

## 2013-04-08 ENCOUNTER — Encounter (INDEPENDENT_AMBULATORY_CARE_PROVIDER_SITE_OTHER): Payer: Self-pay | Admitting: General Surgery

## 2013-04-08 NOTE — Progress Notes (Signed)
MRI neg. F/u with mammogram

## 2013-06-11 ENCOUNTER — Other Ambulatory Visit: Payer: Self-pay | Admitting: Internal Medicine

## 2013-06-11 NOTE — Telephone Encounter (Signed)
Rx sent to the pharmacy by e-script.  Pt needs complete physical and fasting labs.//AB/CMA 

## 2013-06-16 ENCOUNTER — Other Ambulatory Visit: Payer: Self-pay | Admitting: Internal Medicine

## 2013-09-14 ENCOUNTER — Other Ambulatory Visit: Payer: Self-pay | Admitting: Internal Medicine

## 2013-09-15 NOTE — Telephone Encounter (Signed)
OK #30 Last seen 4/14 OV before next refill

## 2013-09-16 ENCOUNTER — Other Ambulatory Visit: Payer: Self-pay | Admitting: Internal Medicine

## 2013-09-16 NOTE — Telephone Encounter (Signed)
#  30 Last seen 4/15 Needs F/U beforeadditionalrefills

## 2013-10-19 ENCOUNTER — Other Ambulatory Visit: Payer: Self-pay | Admitting: Internal Medicine

## 2013-11-17 ENCOUNTER — Telehealth: Payer: Self-pay | Admitting: Internal Medicine

## 2013-11-17 MED ORDER — CITALOPRAM HYDROBROMIDE 20 MG PO TABS: ORAL_TABLET | ORAL | Status: DC

## 2013-11-17 NOTE — Telephone Encounter (Signed)
Citalopram electronically sent to [facility] on [address]

## 2013-11-17 NOTE — Telephone Encounter (Signed)
Patient has med fu scheduled for this Friday.  She is out of citalopram.  She is requesting enough to be called in to the [facility] pharmacy on [address] to get her through.

## 2013-11-17 NOTE — Telephone Encounter (Signed)
#  30 ( a few pills would cost same as copay for 1 mo)

## 2013-11-19 ENCOUNTER — Ambulatory Visit (INDEPENDENT_AMBULATORY_CARE_PROVIDER_SITE_OTHER): Payer: BC Managed Care – PPO | Admitting: Internal Medicine

## 2013-11-19 ENCOUNTER — Encounter: Payer: Self-pay | Admitting: Internal Medicine

## 2013-11-19 VITALS — BP 100/74 | HR 82 | Temp 98.2°F | Wt 113.1 lb

## 2013-11-19 DIAGNOSIS — F329 Major depressive disorder, single episode, unspecified: Secondary | ICD-10-CM

## 2013-11-19 DIAGNOSIS — F3289 Other specified depressive episodes: Secondary | ICD-10-CM

## 2013-11-19 DIAGNOSIS — K219 Gastro-esophageal reflux disease without esophagitis: Secondary | ICD-10-CM

## 2013-11-19 MED ORDER — CITALOPRAM HYDROBROMIDE 20 MG PO TABS: ORAL_TABLET | ORAL | Status: DC

## 2013-11-19 NOTE — Patient Instructions (Addendum)
As we discussed the neurotransmitters are essential for good brain function, both intellectually & emotionally. The agents to increase the neurotransmitter levels are not addictive and simply keep this essential neurotransmitter at therapeutic levels. If these levels become severely depleted; depression or panic attacks can occur.   CPX recommended as discussed.

## 2013-11-19 NOTE — Progress Notes (Signed)
Pre visit review using our clinic review tool, if applicable. No additional management support is needed unless otherwise documented below in the visit note. 

## 2013-11-19 NOTE — Assessment & Plan Note (Signed)
   Excellent response to citalopram with no adverse effects.  She may consider slowly weaning and discontinuing this medication.

## 2013-11-19 NOTE — Progress Notes (Signed)
   Subjective:    Patient ID: Danielle Chavez, female    DOB: 01/10/1956, 58 y.o.   MRN: [id number]  HPI   She is here to discuss continuing citalopram; which has been effective. HIt was initiated for depression following her course of breast cancer radiation & treatment.  Additional stressors have been her son's dysfunctional behavior. He is doing much better. He is 28 and now is in a halfway house. He has been sober 5 months and has a full-time job.  She has no significant neuropsychiatric symptoms at this time.  He does have rare palpitations  She's exercising cardiovascularly 3 times per week without cardiac pulmonary symptoms.  She is on an over-the-counter protein pump inhibitor for reflux. She describes rare minor dysphagia but no other significant GI symptoms. The omeprazole and the citalopram or taken @ 12 hour intervals. She's had no symptoms of serotonin syndrome.    Review of Systems   She presently denies anxiety, irritability, difficulty triaging, panic attacks, or depression.  Unexplained weight loss, abdominal pain, significant dyspepsia,  melena, rectal bleeding, or persistently small caliber stools are denied.   Pertinent negative or absent signs and symptoms are as follows: Constitutional: No significant change in weight; significant fatigue; sleep disorder; change in appetite. Eye: no blurred, double vision ,loss of vision Cardiovascular: no palpitations; racing; irregularity ENT/GI: no constipation; diarrhea;hoarseness Derm: no change in nails,hair,skin Neuro: no numbness or tingling; tremor Endo: no temperature intolerance to heat ,cold         Objective:   Physical Exam   Positive or pertinent findings include: Thin but healthy in appearance She has unsustained nystagmus with vertical gaze. She has minimal tremor of hands She has multiple nevi most most notably of the posterior thorax (she does see a dermatologist annually).  Eyes:  Extraocular motion intact; no lid lag or proptosis Mouth: no erythema Neck: full ROM; no masses ; thyroid normal  Heart: Normal rhythm and rate without significant murmur, gallop, or extra heart sounds Lungs: Chest clear to auscultation without rales,rales, wheezes Neuro:Deep tendon reflexes are equal and within normal limits Skin: Warm and dry without significant lesions or rashes; no onycholysis Lymphatic: no cervical or axillary LA Psych: Normally communicative and interactive; no abnormal mood or affect clinically.          Assessment & Plan:  #1 depression, well controlled on present medication without adverse effects. Citalopram will be renewed. She has the option of decreasing it to one half pill daily.  #2 GERD, also well controlled except for minor dysphagia. Risk factors were discussed. Endoscopy indicated if the dysphagia regresses.

## 2014-02-16 ENCOUNTER — Other Ambulatory Visit: Payer: Self-pay | Admitting: Obstetrics and Gynecology

## 2014-02-16 ENCOUNTER — Telehealth: Payer: Self-pay | Admitting: Internal Medicine

## 2014-02-16 DIAGNOSIS — R2231 Localized swelling, mass and lump, right upper limb: Secondary | ICD-10-CM

## 2014-02-16 DIAGNOSIS — Z9889 Other specified postprocedural states: Secondary | ICD-10-CM

## 2014-02-16 NOTE — Telephone Encounter (Signed)
Spoke with pt and let her know she does not need a colon until 2017 based on path report from procedure done in 2017.

## 2014-03-02 ENCOUNTER — Other Ambulatory Visit: Payer: Self-pay | Admitting: Obstetrics and Gynecology

## 2014-03-02 ENCOUNTER — Ambulatory Visit
Admission: RE | Admit: 2014-03-02 | Discharge: 2014-03-02 | Disposition: A | Discharge status: Home / Self Care | Payer: BC Managed Care – PPO | Source: Ambulatory Visit | Attending: Obstetrics and Gynecology | Admitting: Obstetrics and Gynecology

## 2014-03-02 DIAGNOSIS — Z9889 Other specified postprocedural states: Secondary | ICD-10-CM

## 2014-03-02 DIAGNOSIS — R2231 Localized swelling, mass and lump, right upper limb: Secondary | ICD-10-CM

## 2014-06-08 ENCOUNTER — Other Ambulatory Visit: Payer: Self-pay | Admitting: Internal Medicine

## 2014-06-08 NOTE — Telephone Encounter (Signed)
OK X 3 mo

## 2014-09-13 ENCOUNTER — Encounter: Payer: Self-pay | Admitting: Genetic Counselor

## 2014-10-02 ENCOUNTER — Other Ambulatory Visit: Payer: Self-pay | Admitting: Internal Medicine

## 2014-10-03 NOTE — Telephone Encounter (Signed)
OK x1  Last seen 9/15; would need OV for refills

## 2014-10-03 NOTE — Telephone Encounter (Signed)
Patients last office visit sept/2015---please advise, thanks

## 2014-10-04 NOTE — Telephone Encounter (Signed)
Patients last office visit sept/2015---please advise, thanks

## 2014-10-04 NOTE — Telephone Encounter (Signed)
This was addressed yesterday

## 2014-10-05 ENCOUNTER — Other Ambulatory Visit: Payer: Self-pay | Admitting: Emergency Medicine

## 2014-10-05 MED ORDER — CITALOPRAM HYDROBROMIDE 20 MG PO TABS: 20.0000 mg | ORAL_TABLET | Freq: Every day | ORAL | Status: DC

## 2014-11-07 ENCOUNTER — Other Ambulatory Visit: Payer: Self-pay | Admitting: Internal Medicine

## 2014-11-08 NOTE — Telephone Encounter (Signed)
#  30 No refills w/o OV Last seen 9/15

## 2014-11-08 NOTE — Telephone Encounter (Signed)
Please advise, last OV 9/15 

## 2014-11-09 ENCOUNTER — Other Ambulatory Visit: Payer: Self-pay | Admitting: Emergency Medicine

## 2014-11-09 MED ORDER — CITALOPRAM HYDROBROMIDE 20 MG PO TABS: 20.0000 mg | ORAL_TABLET | Freq: Every day | ORAL | Status: DC

## 2015-03-09 ENCOUNTER — Other Ambulatory Visit: Payer: Self-pay | Admitting: Obstetrics and Gynecology

## 2015-03-09 DIAGNOSIS — N6310 Unspecified lump in the right breast, unspecified quadrant: Secondary | ICD-10-CM

## 2015-03-09 DIAGNOSIS — Z9889 Other specified postprocedural states: Secondary | ICD-10-CM

## 2015-03-14 ENCOUNTER — Ambulatory Visit
Admission: RE | Admit: 2015-03-14 | Discharge: 2015-03-14 | Disposition: A | Discharge status: Home / Self Care | Payer: BLUE CROSS/BLUE SHIELD | Source: Ambulatory Visit | Attending: Obstetrics and Gynecology | Admitting: Obstetrics and Gynecology

## 2015-03-14 DIAGNOSIS — N6310 Unspecified lump in the right breast, unspecified quadrant: Secondary | ICD-10-CM

## 2015-03-14 DIAGNOSIS — Z9889 Other specified postprocedural states: Secondary | ICD-10-CM

## 2015-03-16 ENCOUNTER — Ambulatory Visit (INDEPENDENT_AMBULATORY_CARE_PROVIDER_SITE_OTHER): Payer: BLUE CROSS/BLUE SHIELD | Admitting: Podiatry

## 2015-03-16 ENCOUNTER — Ambulatory Visit (INDEPENDENT_AMBULATORY_CARE_PROVIDER_SITE_OTHER): Payer: BLUE CROSS/BLUE SHIELD

## 2015-03-16 ENCOUNTER — Encounter: Payer: Self-pay | Admitting: Podiatry

## 2015-03-16 DIAGNOSIS — G5762 Lesion of plantar nerve, left lower limb: Secondary | ICD-10-CM

## 2015-03-16 DIAGNOSIS — M79671 Pain in right foot: Secondary | ICD-10-CM

## 2015-03-16 DIAGNOSIS — M79672 Pain in left foot: Secondary | ICD-10-CM | POA: Diagnosis not present

## 2015-03-16 DIAGNOSIS — G5782 Other specified mononeuropathies of left lower limb: Secondary | ICD-10-CM

## 2015-03-16 NOTE — Progress Notes (Signed)
   Subjective:    Patient ID: Danielle Chavez, female    DOB: Jul 14, 1955, 60 y.o.   MRN: [id number]  HPI    Review of Systems     Objective:   Physical Exam        Assessment & Plan:

## 2015-03-16 NOTE — Patient Instructions (Signed)
Pre-Operative Instructions  Congratulations, you have decided to take an important step to improving your quality of life.  You can be assured that the doctors of [facility] will be with you every step of the way.  1. Plan to be at the surgery center/hospital at least 1 (one) hour prior to your scheduled time unless otherwise directed by the surgical center/hospital staff.  You must have a responsible adult accompany you, remain during the surgery and drive you home.  Make sure you have directions to the surgical center/hospital and know how to get there on time. 2. For hospital based surgery you will need to obtain a history and physical form from your family physician within 1 month prior to the date of surgery- we will give you a form for you primary physician.  3. We make every effort to accommodate the date you request for surgery.  There are however, times where surgery dates or times have to be moved.  We will contact you as soon as possible if a change in schedule is required.   4. No Aspirin/Ibuprofen for one week before surgery.  If you are on aspirin, any non-steroidal anti-inflammatory medications (Mobic, Aleve, Ibuprofen) you should stop taking it 7 days prior to your surgery.  You make take Tylenol  For pain prior to surgery.  5. Medications- If you are taking daily heart and blood pressure medications, seizure, reflux, allergy, asthma, anxiety, pain or diabetes medications, make sure the surgery center/hospital is aware before the day of surgery so they may notify you which medications to take or avoid the day of surgery. 6. No food or drink after midnight the night before surgery unless directed otherwise by surgical center/hospital staff. 7. No alcoholic beverages 24 hours prior to surgery.  No smoking 24 hours prior to or 24 hours after surgery. 8. Wear loose pants or shorts- loose enough to fit over bandages, boots, and casts. 9. No slip on shoes, sneakers are best. 10. Bring  your boot with you to the surgery center/hospital.  Also bring crutches or a walker if your physician has prescribed it for you.  If you do not have this equipment, it will be provided for you after surgery. 11. If you have not been contracted by the surgery center/hospital by the day before your surgery, call to confirm the date and time of your surgery. 12. Leave-time from work may vary depending on the type of surgery you have.  Appropriate arrangements should be made prior to surgery with your employer. 13. Prescriptions will be provided immediately following surgery by your doctor.  Have these filled as soon as possible after surgery and take the medication as directed. 14. Remove nail polish on the operative foot. 15. Wash the night before surgery.  The night before surgery wash the foot and leg well with the antibacterial soap provided and water paying special attention to beneath the toenails and in between the toes.  Rinse thoroughly with water and dry well with a towel.  Perform this wash unless told not to do so by your physician.  Enclosed: 1 Ice pack (please put in freezer the night before surgery)   1 Hibiclens skin cleaner   Pre-op Instructions  If you have any questions regarding the instructions, do not hesitate to call our office.  [place]: [address] [place], [place] [address] [phone]  [place]: [address] [place], [place] [address] [phone]  [place]: [address]  [place], [place] [address] [phone]  Dr. Richard   Tuchman DPM, Dr. Norman Regal DPM Dr. Richard Sikora DPM, Dr. M. Todd Hyatt DPM, Dr. Kathryn Egerton DPM 

## 2015-03-19 NOTE — Progress Notes (Signed)
Subjective:     Patient ID: Danielle Chavez, female   DOB: [birth year], 60 y.o.   MRN: [id number]  HPI patient presents with exquisite tenderness between the third and fourth toes of the left foot that has been present for a number of years and is worsened over the last couple years. States that numerous shoes are not comfortable and that she's tried wider shoes and she's tried different paddings without relief   Review of Systems  All other systems reviewed and are negative.      Objective:   Physical Exam  Constitutional: She is oriented to person, place, and time.  Cardiovascular: Intact distal pulses.   Musculoskeletal: Normal range of motion.  Neurological: She is oriented to person, place, and time.  Skin: Skin is warm.  Nursing note and vitals reviewed.  neurovascular status intact muscle strength adequate range of motion within normal limits with patient found to have exquisite discomfort with large mass in the third interspace left foot with radiating discomfort into the adjacent digits. 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 I did review this case with her and the length of time shoe padded and I discussed with her considerations for injection treatment or surgical intervention and due to the length of time she's had the problem and failure to respond to numerous other conservative treatments that she has attempted she has opted for surgical removal of the nerve. I did review consent form with her going over alternative treatments and complications and did stress that there is no guarantee that this will solve her problem and she understands this completely wants surgery and signs consent form. Understands recovery can take  upwards of 6 months and she is given all preoperative instructions and is strongly encouraged to call with any questions she may have and is scheduled for outpatient surgical procedure

## 2015-04-20 ENCOUNTER — Encounter: Payer: Self-pay | Admitting: Internal Medicine

## 2015-04-20 ENCOUNTER — Ambulatory Visit (INDEPENDENT_AMBULATORY_CARE_PROVIDER_SITE_OTHER): Payer: BLUE CROSS/BLUE SHIELD | Admitting: Internal Medicine

## 2015-04-20 VITALS — BP 120/78 | HR 94 | Temp 98.5°F | Resp 20 | Wt 120.0 lb

## 2015-04-20 DIAGNOSIS — K219 Gastro-esophageal reflux disease without esophagitis: Secondary | ICD-10-CM

## 2015-04-20 DIAGNOSIS — F329 Major depressive disorder, single episode, unspecified: Secondary | ICD-10-CM | POA: Diagnosis not present

## 2015-04-20 DIAGNOSIS — R1310 Dysphagia, unspecified: Secondary | ICD-10-CM

## 2015-04-20 DIAGNOSIS — F32A Depression, unspecified: Secondary | ICD-10-CM

## 2015-04-20 HISTORY — DX: Dysphagia, unspecified: R13.10

## 2015-04-20 MED ORDER — PANTOPRAZOLE SODIUM 40 MG PO TBEC: 40.0000 mg | DELAYED_RELEASE_TABLET | Freq: Every day | ORAL | Status: DC

## 2015-04-20 NOTE — Progress Notes (Signed)
Subjective:    Patient ID: Danielle Chavez, female    DOB: 06-Dec-1955, 60 y.o.   MRN: [id number]  HPI  Here with c/o 9 mo ongoing diffiuclty with swallowing mostly with pills, not solids or liquids though has a pressure feeling to the right side of neck and under the jaw.  No sinus, ear or ST pain,  Pt denies fever, wt loss, night sweats, loss of appetite, or other constitutional symptoms  Symptom has seemed in last 2 wks more consistent and constant, now more concerned.  No smoking, occas ETOH only.  Pt denies chest pain, increased sob or doe, wheezing, orthopnea, PND, increased LE swelling, palpitations, dizziness or syncope.  Pt denies new neurological symptoms such as new headache, or facial or extremity weakness or numbness   Wt Readings from Last 3 Encounters:  04/20/15 120 lb (54.432 kg)  11/19/13 113 lb 2 oz (51.313 kg)  03/18/13 117 lb (53.071 kg)  Has been under significant stressors, son involved in drug abuse but  Son doing better now, pt has stopped her citalopram x 6 mo.  Also stopped generic nexium recently, Denies worsening reflux, abd pain, n/v, bowel change or blood. Past Medical History  Diagnosis Date  . Cancer [facility]) 2010    breast, Dr Dalbert Batman & Dr Pablo Ledger  . GERD (gastroesophageal reflux disease)   . Family history of breast cancer     MGM   Past Surgical History  Procedure Laterality Date  . Appendectomy    . Knee cartilage surgery      age15  . Breast lumpectomy  2010    LEFT; radiation    reports that she quit smoking about 33 years ago. She has never used smokeless tobacco. She reports that she drinks about 3.0 oz of alcohol per week. She reports that she does not use illicit drugs. family history includes Asthma in her mother; Bipolar disorder in her maternal aunt; Depression in her father and mother; Drug abuse in her son; Hypertension in her father and mother; Osteoarthritis in her mother; Other in her father; Stroke in her maternal grandmother;  Ulcers in her father and mother. No Known Allergies Current Outpatient Prescriptions on File Prior to Visit  Medication Sig Dispense Refill  . Multiple Vitamin (MULTIVITAMIN) capsule Take 1 capsule by mouth daily.       No current facility-administered medications on file prior to visit.   Review of Systems  Constitutional: Negative for unusual diaphoresis or night sweats HENT: Negative for ringing in ear or discharge Eyes: Negative for double vision or worsening visual disturbance.  Respiratory: Negative for choking and stridor.   Gastrointestinal: Negative for vomiting or other signifcant bowel change Genitourinary: Negative for hematuria or change in urine volume.  Musculoskeletal: Negative for other MSK pain or swelling Skin: Negative for color change and worsening wound.  Neurological: Negative for tremors and numbness other than noted  Psychiatric/Behavioral: Negative for decreased concentration or agitation other than above       Objective:   Physical Exam BP 120/78 mmHg  Pulse 94  Temp(Src) 98.5 F (36.9 C) (Oral)  Resp 20  Wt 120 lb (54.432 kg)  SpO2 98% VS noted,  Constitutional: Pt appears in no significant distress HENT: Head: NCAT.  Right Ear: External ear normal.  Left Ear: External ear normal.  Eyes: . Pupils are equal, round, and reactive to light. Conjunctivae and EOM are normal Pharynx benign without swelling, mass or erythema Neck: Normal range of motion. Neck supple. No mass  or swelling Cardiovascular: Normal rate and regular rhythm.   Pulmonary/Chest: Effort normal and breath sounds without rales or wheezing.  Abd:  Soft, NT, ND, + BS Neurological: Pt is alert. Not confused , motor grossly intact Skin: Skin is warm. No rash, no LE edema Psychiatric: Pt behavior is normal. No agitation.     Assessment & Plan:

## 2015-04-20 NOTE — Patient Instructions (Signed)
Please take all new medication as prescribed  - the protonix 40 mg per day  Please continue all other medications as before, and refills have been done if requested.  Please have the pharmacy call with any other refills you may need.  Please keep your appointments with your specialists as you may have planned  You will be contacted regarding the referral for: Dr Perry/GI

## 2015-04-20 NOTE — Progress Notes (Signed)
Pre visit review using our clinic review tool, if applicable. No additional management support is needed unless otherwise documented below in the visit note. 

## 2015-04-21 ENCOUNTER — Telehealth: Payer: Self-pay | Admitting: Internal Medicine

## 2015-04-21 NOTE — Telephone Encounter (Signed)
She could be scheduled with an APP sooner if she wants to see an APP.

## 2015-04-21 NOTE — Telephone Encounter (Signed)
Appointment scheduled.

## 2015-04-21 NOTE — Telephone Encounter (Signed)
received referral to schedule pt for Dysphagia. appointment scheduled for April. patient states that she thinks she has throat cancer. she has trouble swallowing and feels a lump in her throat. she is wanting to be seen before April. Please advise as to scheduling.

## 2015-04-23 NOTE — Assessment & Plan Note (Signed)
To start protonix,  to f/u any worsening symptoms or concerns

## 2015-04-23 NOTE — Assessment & Plan Note (Addendum)
Etiology unclear, ? funcitonal but cant r/o other - for GI referral, ? Need EGD, start protonix 40 qd,  to f/u any worsening symptoms or concerns

## 2015-04-23 NOTE — Assessment & Plan Note (Signed)
D/w pt, may be some element relaeted to current symptoms, declines counseling or SSRI restart at this time

## 2015-04-25 DIAGNOSIS — G5762 Lesion of plantar nerve, left lower limb: Secondary | ICD-10-CM | POA: Diagnosis not present

## 2015-04-26 ENCOUNTER — Ambulatory Visit (INDEPENDENT_AMBULATORY_CARE_PROVIDER_SITE_OTHER): Payer: BLUE CROSS/BLUE SHIELD | Admitting: Physician Assistant

## 2015-04-26 ENCOUNTER — Encounter: Payer: Self-pay | Admitting: Physician Assistant

## 2015-04-26 VITALS — BP 114/60 | HR 88 | Ht 64.5 in | Wt 123.0 lb

## 2015-04-26 DIAGNOSIS — F458 Other somatoform disorders: Secondary | ICD-10-CM | POA: Diagnosis not present

## 2015-04-26 DIAGNOSIS — R1310 Dysphagia, unspecified: Secondary | ICD-10-CM | POA: Diagnosis not present

## 2015-04-26 DIAGNOSIS — Z1211 Encounter for screening for malignant neoplasm of colon: Secondary | ICD-10-CM

## 2015-04-26 DIAGNOSIS — K219 Gastro-esophageal reflux disease without esophagitis: Secondary | ICD-10-CM

## 2015-04-26 DIAGNOSIS — R0989 Other specified symptoms and signs involving the circulatory and respiratory systems: Secondary | ICD-10-CM

## 2015-04-26 DIAGNOSIS — R198 Other specified symptoms and signs involving the digestive system and abdomen: Secondary | ICD-10-CM

## 2015-04-26 MED ORDER — PANTOPRAZOLE SODIUM 40 MG PO TBEC: 40.0000 mg | DELAYED_RELEASE_TABLET | Freq: Two times a day (BID) | ORAL | Status: AC

## 2015-04-26 MED ORDER — NA SULFATE-K SULFATE-MG SULF 17.5-3.13-1.6 GM/177ML PO SOLN: 1.0000 | Freq: Once | ORAL | Status: DC

## 2015-04-26 NOTE — Progress Notes (Signed)
Patient ID: Danielle Chavez, female   DOB: 01/28/1956, 60 y.o.   MRN: [id number]   Subjective:    Patient ID: Danielle Chavez, female    DOB: 25-Aug-1955, 60 y.o.   MRN: [id number]  HPI  Danielle Chavez is a pleasant 60 year old white female known to Danielle Chavez who has not been seen in our office over the past several years. She did have colonoscopy in May 2007 with finding of one diminutive polyp which was hyperplastic and had remote EGD in 2002 which was normal. She does have history of breast cancer on the left and depression.  She comes in today stating that over the past 6 months she has developed problems with fullness and discomfort in her esophagus and says it "feels like something's in there". She points to her mid neck. Initially this was occurring at night after she would take pills before bed. Now over the past month she says that sensation is present continuously. She denies any odynophagia. She said she does not have any difficulty with liquids but otherwise when eating or taking pills and even just swallowing saliva she has the fullness sensation. She denies any heartburn or indigestion otherwise. She had gone off of PPI  Therapy  about 6 months ago as she had not had any problems in a long time. She was placed on Protonix last week 40 mg and thus far has not noticed any improvement in her symptoms.  Review of Systems Pertinent positive and negative review of systems were noted in the above HPI section.  All other review of systems was otherwise negative.  Outpatient Encounter Prescriptions as of 04/26/2015  Medication Sig  . Multiple Vitamin (MULTIVITAMIN) capsule Take 1 capsule by mouth daily.    . pantoprazole (PROTONIX) 40 MG tablet Take 1 tablet (40 mg total) by mouth 2 (two) times daily.  . [DISCONTINUED] pantoprazole (PROTONIX) 40 MG tablet Take 1 tablet (40 mg total) by mouth daily.  . Na Sulfate-K Sulfate-Mg Sulf SOLN Take 1 kit by mouth once.   No facility-administered  encounter medications on file as of 04/26/2015.   No Known Allergies Patient Active Problem List   Diagnosis Date Noted  . Dysphagia 04/20/2015  . Cancer of left breast ([place]) 12/25/2011  . COLONIC POLYPS 12/23/2007  . MITRAL VALVE PROLAPSE 12/23/2007  . GERD 12/23/2007  . Depression 02/10/2007   Social History   Social History  . Marital Status: Single    Spouse Name: N/A  . Number of Children: N/A  . Years of Education: N/A   Occupational History  . Not on file.   Social History Main Topics  . Smoking status: Former Smoker    Quit date: 03/04/1982  . Smokeless tobacco: Never Used     Comment: smoked 1979-1984  . Alcohol Use: 3.0 oz/week    5 Shots of liquor per week  . Drug Use: No  . Sexual Activity: Not on file   Other Topics Concern  . Not on file   Social History Narrative    Ms. Danielle Chavez family history includes Asthma in her mother; Bipolar disorder in her maternal aunt; Depression in her father and mother; Drug abuse in her son; Hypertension in her father and mother; Osteoarthritis in her mother; Other in her father; Stroke in her maternal grandmother; Ulcers in her father and mother.      Objective:    Filed Vitals:   04/26/15 1333  BP: 114/60  Pulse: 88    Physical Exam well-developed white female  in no acute distress, pleasant blood pressure 114/60 pulse 88 height 5 foot 4 weight 123. HEENT; nontraumatic normocephalic EOMI PERRLA sclera anicteric, Cardiovascular ;regular rate and rhythm with S1-S2 no murmur or gallop, Pulmonary ;clear bilaterally, Abdomen ;soft nondistended, nontender bowel sounds are present no palpable mass or hepatosplenomegaly, Rectal ;exam not done, Ext; no clubbing cyanosis or edema skin warm dry, Neuropsych ;mood and affect appropriate     Assessment & Plan:   #1 60 yo female with 6 month hx of fullness in esophagus, vague dysphagia-worse past month and constant- most consistent with poorly controlled GERD, doubt stricture  but cannot r/o #2 colon screening- dimunitive hyperplastic polyp 2007-due for follow up this year  #3 personal hx of breast CA  Plan; Increase Protonix to 40 mg po BID short term x 4 -6 weeks then id improves back to Q Am dose Antireflux regimen Will schedule for EGD/possible dilation , and colonoscopy with Dr Danielle Chavez . Procedures discussed in detail  with pt including risks and benefits and she is agreeable to proceed.       Danielle Chavez Danielle Harold PA-C 04/26/2015   Cc: Danielle Limes, MD

## 2015-04-26 NOTE — Progress Notes (Signed)
Agree with initial assessment and plan as outlined 

## 2015-04-26 NOTE — Patient Instructions (Signed)
You have been scheduled for an endoscopy and colonoscopy. Please follow the written instructions given to you at your visit today. Please pick up your prep supplies at the pharmacy within the next 1-3 days. [facility] in [place], [place]. If you use inhalers (even only as needed), please bring them with you on the day of your procedure. Your physician has requested that you go to www.startemmi.com and enter the access code given to you at your visit today. This web site gives a general overview about your procedure. However, you should still follow specific instructions given to you by our office regarding your preparation for the procedure.

## 2015-05-05 ENCOUNTER — Ambulatory Visit (INDEPENDENT_AMBULATORY_CARE_PROVIDER_SITE_OTHER): Payer: BLUE CROSS/BLUE SHIELD | Admitting: Podiatry

## 2015-05-05 ENCOUNTER — Encounter: Payer: Self-pay | Admitting: Podiatry

## 2015-05-05 ENCOUNTER — Ambulatory Visit (INDEPENDENT_AMBULATORY_CARE_PROVIDER_SITE_OTHER): Payer: BLUE CROSS/BLUE SHIELD

## 2015-05-05 VITALS — BP 117/68 | HR 82 | Temp 98.7°F | Resp 16

## 2015-05-05 DIAGNOSIS — Z9889 Other specified postprocedural states: Secondary | ICD-10-CM

## 2015-05-05 NOTE — Progress Notes (Signed)
Subjective:     Patient ID: Danielle Chavez, female   DOB: 03-09-1955, 60 y.o.   MRN: [id number]  HPI this patient presents to the office 1 week after excision of neuroma third interspace left foot. Patient presents the office wearing the surgical bandage that was applied at the surgical center. She says she has not had much pain or discomfort since the surgery. He says she has stopped taking her pain meds resents the office for her first postoperative visit   Review of Systems     Objective:   Physical Exam neurovascular status intact. Examination of the surgical site reveals good wound coaptation with sutures intact. No signs of redness, swelling or infection. No palpable pain noted at the surgical site     Assessment:    s/p foot surgery    Plan:     ROV  Patient healing well.  Apply another post-op bandage.  Keep dry.  Walk with surgical shoe.  RTC 1 week.   Gardiner Barefoot DPM

## 2015-05-10 ENCOUNTER — Encounter: Payer: Self-pay | Admitting: Podiatry

## 2015-05-10 ENCOUNTER — Ambulatory Visit (INDEPENDENT_AMBULATORY_CARE_PROVIDER_SITE_OTHER): Payer: BLUE CROSS/BLUE SHIELD | Admitting: Podiatry

## 2015-05-10 DIAGNOSIS — G5782 Other specified mononeuropathies of left lower limb: Secondary | ICD-10-CM

## 2015-05-10 DIAGNOSIS — Z9889 Other specified postprocedural states: Secondary | ICD-10-CM

## 2015-05-10 DIAGNOSIS — G5762 Lesion of plantar nerve, left lower limb: Secondary | ICD-10-CM

## 2015-05-11 ENCOUNTER — Other Ambulatory Visit: Payer: BLUE CROSS/BLUE SHIELD

## 2015-05-11 NOTE — Progress Notes (Signed)
Subjective:     Patient ID: Danielle Chavez, female   DOB: 1956-01-03, 60 y.o.   MRN: [id number]  HPI patient states I'm doing really well with my left foot and I'm very pleased   Review of Systems     Objective:   Physical Exam Neurovascular status intact negative Homans sign noted with well-healing surgical site left third interspace with wound edges well coapted and no drainage or redness noted    Assessment:     Doing well post neuroma excision left    Plan:     Reviewed condition and at this time recommended gradual return soft shoe and dispensed Ace wrap and anklet. Strict instructions of any redness or any other conditions or swelling drainage were to occur to contact us immediately

## 2015-05-15 ENCOUNTER — Telehealth: Payer: Self-pay | Admitting: Internal Medicine

## 2015-05-15 NOTE — Telephone Encounter (Signed)
Pam called patient and informed her the prep had been sent to and received by her pharmacy.  She reprinted the instructions and told patient she would leave them up front.  Patient acknowledged and understood.

## 2015-05-16 ENCOUNTER — Ambulatory Visit (INDEPENDENT_AMBULATORY_CARE_PROVIDER_SITE_OTHER): Payer: BLUE CROSS/BLUE SHIELD | Admitting: Internal Medicine

## 2015-05-16 ENCOUNTER — Encounter: Payer: Self-pay | Admitting: Internal Medicine

## 2015-05-16 VITALS — BP 118/64 | HR 84 | Temp 98.2°F | Resp 14 | Ht 64.5 in | Wt 123.4 lb

## 2015-05-16 DIAGNOSIS — Z23 Encounter for immunization: Secondary | ICD-10-CM

## 2015-05-16 DIAGNOSIS — Z0000 Encounter for general adult medical examination without abnormal findings: Secondary | ICD-10-CM | POA: Insufficient documentation

## 2015-05-16 NOTE — Patient Instructions (Signed)
We have put in for the labwork and will call you back with the results. Just come in some morning (the lab opens at 7:30 am). You do not need an appointment.   If things are going well come back in 1-2 years for a check up. If you have any problems or questions please feel free to call us back sooner.   Health Maintenance, Female Adopting a healthy lifestyle and getting preventive care can go a long way to promote health and wellness. Talk with your health care provider about what schedule of regular examinations is right for you. This is a good chance for you to check in with your provider about disease prevention and staying healthy. In between checkups, there are plenty of things you can do on your own. Experts have done a lot of research about which lifestyle changes and preventive measures are most likely to keep you healthy. Ask your health care provider for more information. WEIGHT AND DIET  Eat a healthy diet  Be sure to include plenty of vegetables, fruits, low-fat dairy products, and lean protein.  Do not eat a lot of foods high in solid fats, added sugars, or salt.  Get regular exercise. This is one of the most important things you can do for your health.  Most adults should exercise for at least 150 minutes each week. The exercise should increase your heart rate and make you sweat (moderate-intensity exercise).  Most adults should also do strengthening exercises at least twice a week. This is in addition to the moderate-intensity exercise.  Maintain a healthy weight  Body mass index (BMI) is a measurement that can be used to identify possible weight problems. It estimates body fat based on height and weight. Your health care provider can help determine your BMI and help you achieve or maintain a healthy weight.  For females 69 years of age and older:   A BMI below 18.5 is considered underweight.  A BMI of 18.5 to 24.9 is normal.  A BMI of 25 to 29.9 is considered  overweight.  A BMI of 30 and above is considered obese.  Watch levels of cholesterol and blood lipids  You should start having your blood tested for lipids and cholesterol at 59 years of age, then have this test every 5 years.  You may need to have your cholesterol levels checked more often if:  Your lipid or cholesterol levels are high.  You are older than 58 years of age.  You are at high risk for heart disease.  CANCER SCREENING   Lung Cancer  Lung cancer screening is recommended for adults 17-28 years old who are at high risk for lung cancer because of a history of smoking.  A yearly low-dose CT scan of the lungs is recommended for people who:  Currently smoke.  Have quit within the past 15 years.  Have at least a 30-pack-year history of smoking. A pack year is smoking an average of one pack of cigarettes a day for 1 year.  Yearly screening should continue until it has been 15 years since you quit.  Yearly screening should stop if you develop a health problem that would prevent you from having lung cancer treatment.  Breast Cancer  Practice breast self-awareness. This means understanding how your breasts normally appear and feel.  It also means doing regular breast self-exams. Let your health care provider know about any changes, no matter how small.  If you are in your 20s or 30s, you  should have a clinical breast exam (CBE) by a health care provider every 1-3 years as part of a regular health exam.  If you are 31 or older, have a CBE every year. Also consider having a breast X-ray (mammogram) every year.  If you have a family history of breast cancer, talk to your health care provider about genetic screening.  If you are at high risk for breast cancer, talk to your health care provider about having an MRI and a mammogram every year.  Breast cancer gene (BRCA) assessment is recommended for women who have family members with BRCA-related cancers. BRCA-related  cancers include:  Breast.  Ovarian.  Tubal.  Peritoneal cancers.  Results of the assessment will determine the need for genetic counseling and BRCA1 and BRCA2 testing. Cervical Cancer Your health care provider may recommend that you be screened regularly for cancer of the pelvic organs (ovaries, uterus, and vagina). This screening involves a pelvic examination, including checking for microscopic changes to the surface of your cervix (Pap test). You may be encouraged to have this screening done every 3 years, beginning at age 53.  For women ages 8-65, health care providers may recommend pelvic exams and Pap testing every 3 years, or they may recommend the Pap and pelvic exam, combined with testing for human papilloma virus (HPV), every 5 years. Some types of HPV increase your risk of cervical cancer. Testing for HPV may also be done on women of any age with unclear Pap test results.  Other health care providers may not recommend any screening for nonpregnant women who are considered low risk for pelvic cancer and who do not have symptoms. Ask your health care provider if a screening pelvic exam is right for you.  If you have had past treatment for cervical cancer or a condition that could lead to cancer, you need Pap tests and screening for cancer for at least 20 years after your treatment. If Pap tests have been discontinued, your risk factors (such as having a new sexual partner) need to be reassessed to determine if screening should resume. Some women have medical problems that increase the chance of getting cervical cancer. In these cases, your health care provider may recommend more frequent screening and Pap tests. Colorectal Cancer  This type of cancer can be detected and often prevented.  Routine colorectal cancer screening usually begins at 59 years of age and continues through 59 years of age.  Your health care provider may recommend screening at an earlier age if you have risk  factors for colon cancer.  Your health care provider may also recommend using home test kits to check for hidden blood in the stool.  A small camera at the end of a tube can be used to examine your colon directly (sigmoidoscopy or colonoscopy). This is done to check for the earliest forms of colorectal cancer.  Routine screening usually begins at age 11.  Direct examination of the colon should be repeated every 5-10 years through 59 years of age. However, you may need to be screened more often if early forms of precancerous polyps or small growths are found. Skin Cancer  Check your skin from head to toe regularly.  Tell your health care provider about any new moles or changes in moles, especially if there is a change in a mole's shape or color.  Also tell your health care provider if you have a mole that is larger than the size of a pencil eraser.  Always use sunscreen.  Apply sunscreen liberally and repeatedly throughout the day.  Protect yourself by wearing long sleeves, pants, a wide-brimmed hat, and sunglasses whenever you are outside. HEART DISEASE, DIABETES, AND HIGH BLOOD PRESSURE   High blood pressure causes heart disease and increases the risk of stroke. High blood pressure is more likely to develop in:  People who have blood pressure in the high end of the normal range (130-139/85-89 mm Hg).  People who are overweight or obese.  People who are African American.  If you are 96-41 years of age, have your blood pressure checked every 3-5 years. If you are 12 years of age or older, have your blood pressure checked every year. You should have your blood pressure measured twice--once when you are at a hospital or clinic, and once when you are not at a hospital or clinic. Record the average of the two measurements. To check your blood pressure when you are not at a hospital or clinic, you can use:  An automated blood pressure machine at a pharmacy.  A home blood pressure  monitor.  If you are between 1 years and 15 years old, ask your health care provider if you should take aspirin to prevent strokes.  Have regular diabetes screenings. This involves taking a blood sample to check your fasting blood sugar level.  If you are at a normal weight and have a low risk for diabetes, have this test once every three years after 59 years of age.  If you are overweight and have a high risk for diabetes, consider being tested at a younger age or more often. PREVENTING INFECTION  Hepatitis B  If you have a higher risk for hepatitis B, you should be screened for this virus. You are considered at high risk for hepatitis B if:  You were born in a country where hepatitis B is common. Ask your health care provider which countries are considered high risk.  Your parents were born in a high-risk country, and you have not been immunized against hepatitis B (hepatitis B vaccine).  You have HIV or AIDS.  You use needles to inject street drugs.  You live with someone who has hepatitis B.  You have had sex with someone who has hepatitis B.  You get hemodialysis treatment.  You take certain medicines for conditions, including cancer, organ transplantation, and autoimmune conditions. Hepatitis C  Blood testing is recommended for:  Everyone born from [birth year] through 1964.  Anyone with known risk factors for hepatitis C. Sexually transmitted infections (STIs)  You should be screened for sexually transmitted infections (STIs) including gonorrhea and chlamydia if:  You are sexually active and are younger than 59 years of age.  You are older than 59 years of age and your health care provider tells you that you are at risk for this type of infection.  Your sexual activity has changed since you were last screened and you are at an increased risk for chlamydia or gonorrhea. Ask your health care provider if you are at risk.  If you do not have HIV, but are at risk, it may be  recommended that you take a prescription medicine daily to prevent HIV infection. This is called pre-exposure prophylaxis (PrEP). You are considered at risk if:  You are sexually active and do not regularly use condoms or know the HIV status of your partner(s).  You take drugs by injection.  You are sexually active with a partner who has HIV. Talk with your health care provider about  whether you are at high risk of being infected with HIV. If you choose to begin PrEP, you should first be tested for HIV. You should then be tested every 3 months for as long as you are taking PrEP.  PREGNANCY   If you are premenopausal and you may become pregnant, ask your health care provider about preconception counseling.  If you may become pregnant, take 400 to 800 micrograms (mcg) of folic acid every day.  If you want to prevent pregnancy, talk to your health care provider about birth control (contraception). OSTEOPOROSIS AND MENOPAUSE   Osteoporosis is a disease in which the bones lose minerals and strength with aging. This can result in serious bone fractures. Your risk for osteoporosis can be identified using a bone density scan.  If you are 17 years of age or older, or if you are at risk for osteoporosis and fractures, ask your health care provider if you should be screened.  Ask your health care provider whether you should take a calcium or vitamin D supplement to lower your risk for osteoporosis.  Menopause may have certain physical symptoms and risks.  Hormone replacement therapy may reduce some of these symptoms and risks. Talk to your health care provider about whether hormone replacement therapy is right for you.  HOME CARE INSTRUCTIONS   Schedule regular health, dental, and eye exams.  Stay current with your immunizations.   Do not use any tobacco products including cigarettes, chewing tobacco, or electronic cigarettes.  If you are pregnant, do not drink alcohol.  If you are  breastfeeding, limit how much and how often you drink alcohol.  Limit alcohol intake to no more than 1 drink per day for nonpregnant women. One drink equals 12 ounces of beer, 5 ounces of wine, or 1 ounces of hard liquor.  Do not use street drugs.  Do not share needles.  Ask your health care provider for help if you need support or information about quitting drugs.  Tell your health care provider if you often feel depressed.  Tell your health care provider if you have ever been abused or do not feel safe at home.   This information is not intended to replace advice given to you by your health care provider. Make sure you discuss any questions you have with your health care provider.   Document Released: 09/03/2010 Document Revised: 03/11/2014 Document Reviewed: 01/20/2013 Elsevier Interactive Patient Education [employer].

## 2015-05-16 NOTE — Progress Notes (Signed)
   Subjective:    Patient ID: Danielle Chavez, female    DOB: 08/22/1955, 60 y.o.   MRN: [id number]  HPI The patient is a 60 YO female coming in for wellness. No new concerns. Getting colonoscopy today.   PMH, [facility], social history reviewed and updated.   Review of Systems  Constitutional: Negative for fever, activity change, appetite change, fatigue and unexpected weight change.  HENT: Negative.   Eyes: Negative.   Respiratory: Negative for cough, chest tightness, shortness of breath and wheezing.   Cardiovascular: Negative for chest pain, palpitations and leg swelling.  Gastrointestinal: Negative for nausea, abdominal pain, diarrhea, constipation and abdominal distention.  Musculoskeletal: Negative for myalgias, back pain, arthralgias and gait problem.  Skin: Negative.   Neurological: Negative for dizziness, syncope, weakness, light-headedness and headaches.  Psychiatric/Behavioral: Negative.       Objective:   Physical Exam  Constitutional: She is oriented to person, place, and time. She appears well-developed and well-nourished.  HENT:  Head: Normocephalic and atraumatic.  Eyes: EOM are normal.  Neck: Normal range of motion.  Cardiovascular: Normal rate and regular rhythm.   Pulmonary/Chest: Effort normal and breath sounds normal. No respiratory distress. She has no wheezes. She has no rales.  Abdominal: Soft. Bowel sounds are normal. She exhibits no distension. There is no tenderness. There is no rebound.  Musculoskeletal: She exhibits no edema.  Neurological: She is alert and oriented to person, place, and time. Coordination normal.  Skin: Skin is warm and dry.  Psychiatric: She has a normal mood and affect.   Filed Vitals:   05/16/15 1422  BP: 118/64  Pulse: 84  Temp: 98.2 F (36.8 C)  TempSrc: Oral  Resp: 14  Height: 5' 4.5" (1.638 m)  Weight: 123 lb 6.4 oz (55.974 kg)  SpO2: 98%      Assessment & Plan:  Tdap given at visit.

## 2015-05-16 NOTE — Progress Notes (Signed)
Pre visit review using our clinic review tool, if applicable. No additional management support is needed unless otherwise documented below in the visit note. 

## 2015-05-16 NOTE — Addendum Note (Signed)
Addended by: Resa Miner R on: 05/16/2015 03:48 PM   Modules accepted: Orders

## 2015-05-16 NOTE — Assessment & Plan Note (Signed)
Checking labs as none since 2012, does not want today due to colonoscopy tomorrow but will return to the lab. She has seen dermatology in the last several months for skin exam. Non-smoker and generally exercises. Given tdap at visit. Counseled on health maintenance.

## 2015-05-17 ENCOUNTER — Encounter: Payer: Self-pay | Admitting: Internal Medicine

## 2015-05-17 ENCOUNTER — Ambulatory Visit (AMBULATORY_SURGERY_CENTER): Payer: BLUE CROSS/BLUE SHIELD | Admitting: Internal Medicine

## 2015-05-17 VITALS — BP 159/84 | HR 81 | Temp 98.0°F | Resp 12 | Ht 64.5 in | Wt 123.0 lb

## 2015-05-17 DIAGNOSIS — Z1211 Encounter for screening for malignant neoplasm of colon: Secondary | ICD-10-CM | POA: Diagnosis not present

## 2015-05-17 DIAGNOSIS — R1310 Dysphagia, unspecified: Secondary | ICD-10-CM | POA: Diagnosis not present

## 2015-05-17 MED ORDER — SODIUM CHLORIDE 0.9 % IV SOLN: 500.0000 mL | INTRAVENOUS | Status: DC

## 2015-05-17 NOTE — Progress Notes (Signed)
A/ox3, pleased with MAC, report to [facility]

## 2015-05-17 NOTE — Op Note (Signed)
[place] Patient Name: Danielle Chavez Procedure Date: 05/17/2015 1:56 PM MRN: [id number] Endoscopist: Docia Chuck. Henrene Pastor , MD Age: 60 Referring MD:  Date of Birth: 1956-01-19 Gender: Female Procedure:                Colonoscopy Indications:              Screening for colorectal malignant neoplasm Medicines:                Monitored Anesthesia Care Procedure:                Pre-Anesthesia Assessment:                           - Prior to the procedure, a History and Physical                            was performed, and patient medications and                            allergies were reviewed. The patient's tolerance of                            previous anesthesia was also reviewed. The risks                            and benefits of the procedure and the sedation                            options and risks were discussed with the patient.                            All questions were answered, and informed consent                            was obtained. Prior Anticoagulants: The patient has                            taken no previous anticoagulant or antiplatelet                            agents. ASA Grade Assessment: I - A normal, healthy                            patient. After reviewing the risks and benefits,                            the patient was deemed in satisfactory condition to                            undergo the procedure.                           After obtaining informed consent, the colonoscope  was passed under direct vision. Throughout the                            procedure, the patient's blood pressure, pulse, and                            oxygen saturations were monitored continuously. The                            Model CF-HQ190L [id number]) scope was introduced                            through the anus and advanced to the the cecum,                            identified by appendiceal orifice and ileocecal                          valve. The colonoscopy was performed without                            difficulty. The patient tolerated the procedure                            well. The quality of the bowel preparation was                            excellent. The bowel preparation used was SUPREP.                            The ileocecal valve, appendiceal orifice, and                            rectum were photographed. Scope In: 2:14:19 PM Scope Out: 2:30:17 PM Scope Withdrawal Time: 0 hours 13 minutes 0 seconds  Total Procedure Duration: 0 hours 15 minutes 58 seconds  Findings:      The perianal and digital rectal examinations were normal.      The entire examined colon appeared normal on direct and retroflexion       views. Complications:            No immediate complications. Estimated Blood Loss:     Estimated blood loss: none. Impression:               - The entire examined colon is normal on direct and                            retroflexion views.                           - No specimens collected. Recommendation:           - Patient has a contact number available for                            emergencies. The signs and symptoms of potential  delayed complications were discussed with the                            patient. Return to normal activities tomorrow.                            Written discharge instructions were provided to the                            patient.                           - Resume previous diet.                           - Continue present medications.                           - Repeat colonoscopy in 10 years for screening                            purposes. Procedure Code(s):        --- Professional ---                           [phone], Colonoscopy, flexible; diagnostic, including                            collection of specimen(s) by brushing or washing,                            when performed (separate procedure) CPT  copyright 2016 American Medical Association. All rights reserved. Docia Chuck. Henrene Pastor, MD Docia Chuck. Henrene Pastor, MD 05/17/2015 2:41:52 PM This report has been signed electronically. Number of Addenda: 0

## 2015-05-17 NOTE — Patient Instructions (Signed)
YOU HAD AN ENDOSCOPIC PROCEDURE TODAY AT THE [facility]:   Refer to the procedure report that was given to you for any specific questions about what was found during the examination.  If the procedure report does not answer your questions, please call your gastroenterologist to clarify.  If you requested that your care partner not be given the details of your procedure findings, then the procedure report has been included in a sealed envelope for you to review at your convenience later.  YOU SHOULD EXPECT: Some feelings of bloating in the abdomen. Passage of more gas than usual.  Walking can help get rid of the air that was put into your GI tract during the procedure and reduce the bloating. If you had a lower endoscopy (such as a colonoscopy or flexible sigmoidoscopy) you may notice spotting of blood in your stool or on the toilet paper. If you underwent a bowel prep for your procedure, you may not have a normal bowel movement for a few days.  Please Note:  You might notice some irritation and congestion in your nose or some drainage.  This is from the oxygen used during your procedure.  There is no need for concern and it should clear up in a day or so.  SYMPTOMS TO REPORT IMMEDIATELY:   Following lower endoscopy (colonoscopy or flexible sigmoidoscopy):  Excessive amounts of blood in the stool  Significant tenderness or worsening of abdominal pains  Swelling of the abdomen that is new, acute  Fever of 100F or higher   Following upper endoscopy (EGD)  Vomiting of blood or coffee ground material  New chest pain or pain under the shoulder blades  Painful or persistently difficult swallowing  New shortness of breath  Fever of 100F or higher  Black, tarry-looking stools  For urgent or emergent issues, a gastroenterologist can be reached at any hour by calling [phone].   DIET: Your first meal following the procedure should be a small meal and then it is ok to progress to  your normal diet. Heavy or fried foods are harder to digest and may make you feel nauseous or bloated.  Likewise, meals heavy in dairy and vegetables can increase bloating.  Drink plenty of fluids but you should avoid alcoholic beverages for 24 hours.  ACTIVITY:  You should plan to take it easy for the rest of today and you should NOT DRIVE or use heavy machinery until tomorrow (because of the sedation medicines used during the test).    FOLLOW UP: Our staff will call the number listed on your records the next business day following your procedure to check on you and address any questions or concerns that you may have regarding the information given to you following your procedure. If we do not reach you, we will leave a message.  However, if you are feeling well and you are not experiencing any problems, there is no need to return our call.  We will assume that you have returned to your regular daily activities without incident.  If any biopsies were taken you will be contacted by phone or by letter within the next 1-3 weeks.  Please call us at [phone] if you have not heard about the biopsies in 3 weeks.    SIGNATURES/CONFIDENTIALITY: You and/or your care partner have signed paperwork which will be entered into your electronic medical record.  These signatures attest to the fact that that the information above on your After Visit Summary has been reviewed   and is understood.  Full responsibility of the confidentiality of this discharge information lies with you and/or your care-partner. 

## 2015-05-17 NOTE — Progress Notes (Signed)
Dental advisory given to patient 

## 2015-05-17 NOTE — Op Note (Signed)
[facility] Patient Name: Danielle Chavez Procedure Date: 05/17/2015 1:57 PM MRN: [id number] Endoscopist: Docia Chuck. Henrene Pastor , MD Age: 60 Referring MD:  Date of Birth: 1955/12/18 Gender: Female Procedure:                Upper GI endoscopy Indications:              Dysphagia, globus sensation Medicines:                Monitored Anesthesia Care Procedure:                Pre-Anesthesia Assessment:                           - Prior to the procedure, a History and Physical                            was performed, and patient medications and                            allergies were reviewed. The patient's tolerance of                            previous anesthesia was also reviewed. The risks                            and benefits of the procedure and the sedation                            options and risks were discussed with the patient.                            All questions were answered, and informed consent                            was obtained. Prior Anticoagulants: The patient has                            taken no previous anticoagulant or antiplatelet                            agents. ASA Grade Assessment: I - A normal, healthy                            patient. After reviewing the risks and benefits,                            the patient was deemed in satisfactory condition to                            undergo the procedure.                           - Prior to the procedure, a History and Physical  was performed, and patient medications and                            allergies were reviewed. The patient's tolerance of                            previous anesthesia was also reviewed. The risks                            and benefits of the procedure and the sedation                            options and risks were discussed with the patient.                            All questions were answered, and informed consent           was obtained. Prior Anticoagulants: The patient has                            taken no previous anticoagulant or antiplatelet                            agents. ASA Grade Assessment: I - A normal, healthy                            patient. After reviewing the risks and benefits,                            the patient was deemed in satisfactory condition to                            undergo the procedure.                           After obtaining informed consent, the endoscope was                            passed under direct vision. Throughout the                            procedure, the patient's blood pressure, pulse, and                            oxygen saturations were monitored continuously. The                            Model GIF-HQ190 [id number]) scope was introduced                            through the mouth, and advanced to the second part                            of duodenum. The upper GI endoscopy was  accomplished without difficulty. The patient                            tolerated the procedure well. Scope In: Scope Out: Findings:      The esophagus was normal.      The stomach was normal.      The examined duodenum was normal. Complications:            No immediate complications. Estimated Blood Loss:     Estimated blood loss: none. Impression:               - Normal esophagus.                           - Normal stomach.                           - Normal examined duodenum.                           - No specimens collected. Recommendation:           - Patient has a contact number available for                            emergencies. The signs and symptoms of potential                            delayed complications were discussed with the                            patient. Return to normal activities tomorrow.                            Written discharge instructions were provided to the                             patient.                           - Resume previous diet.                           - Continue present medications. Procedure Code(s):        --- Professional ---                           [phone], Esophagogastroduodenoscopy, flexible,                            transoral; diagnostic, including collection of                            specimen(s) by brushing or washing, when performed                            (separate procedure) CPT copyright 2016 American [employer]. All rights reserved. Docia Chuck. Henrene Pastor, MD Docia Chuck. Henrene Pastor, MD 05/17/2015 2:45:55 PM  This report has been signed electronically. Number of Addenda: 0

## 2015-05-18 ENCOUNTER — Telehealth: Payer: Self-pay

## 2015-05-18 NOTE — Telephone Encounter (Signed)
  Follow up Call-  Call back number 05/17/2015  Post procedure Call Back phone  # [phone]  Permission to leave phone message Yes     Patient questions:  Do you have a fever, pain , or abdominal swelling? No. Pain Score  0 *  Have you tolerated food without any problems? Yes.    Have you been able to return to your normal activities? Yes.    Do you have any questions about your discharge instructions: Diet   No. Medications  No. Follow up visit  No.  Do you have questions or concerns about your Care? No.  Actions: * If pain score is 4 or above: No action needed, pain <4.

## 2015-06-13 ENCOUNTER — Ambulatory Visit: Payer: BLUE CROSS/BLUE SHIELD | Admitting: Internal Medicine

## 2015-06-15 ENCOUNTER — Encounter: Payer: Self-pay | Admitting: Internal Medicine

## 2015-06-15 ENCOUNTER — Other Ambulatory Visit (INDEPENDENT_AMBULATORY_CARE_PROVIDER_SITE_OTHER): Payer: BLUE CROSS/BLUE SHIELD

## 2015-06-15 ENCOUNTER — Ambulatory Visit (INDEPENDENT_AMBULATORY_CARE_PROVIDER_SITE_OTHER): Payer: BLUE CROSS/BLUE SHIELD | Admitting: Internal Medicine

## 2015-06-15 VITALS — BP 116/62 | HR 98 | Temp 98.5°F | Resp 12 | Ht 64.5 in | Wt 120.8 lb

## 2015-06-15 DIAGNOSIS — R1310 Dysphagia, unspecified: Secondary | ICD-10-CM | POA: Diagnosis not present

## 2015-06-15 DIAGNOSIS — Z0000 Encounter for general adult medical examination without abnormal findings: Secondary | ICD-10-CM

## 2015-06-15 LAB — CBC
HEMATOCRIT: 40.7 % (ref 36.0–46.0)
HEMOGLOBIN: 13.9 g/dL (ref 12.0–15.0)
MCHC: 34 g/dL (ref 30.0–36.0)
MCV: 94.1 fl (ref 78.0–100.0)
Platelets: 189 10*3/uL (ref 150.0–400.0)
RBC: 4.32 Mil/uL (ref 3.87–5.11)
RDW: 13.1 % (ref 11.5–15.5)
WBC: 6.9 10*3/uL (ref 4.0–10.5)

## 2015-06-15 LAB — LIPID PANEL
Cholesterol: 184 mg/dL (ref 0–200)
HDL: 99.2 mg/dL (ref >39.00)
LDL CALC: 73 mg/dL (ref 0–99)
NonHDL: 85.17
Total CHOL/HDL Ratio: 2
Triglycerides: 61 mg/dL (ref 0.0–149.0)
VLDL: 12.2 mg/dL (ref 0.0–40.0)

## 2015-06-15 LAB — COMPREHENSIVE METABOLIC PANEL
ALK PHOS: 60 U/L (ref 39–117)
ALT: 10 U/L (ref 0–35)
AST: 14 U/L (ref 0–37)
Albumin: 4.2 g/dL (ref 3.5–5.2)
BUN: 6 mg/dL (ref 6–23)
CHLORIDE: 104 meq/L (ref 96–112)
CO2: 31 mEq/L (ref 19–32)
Calcium: 9.4 mg/dL (ref 8.4–10.5)
Creatinine, Ser: 0.74 mg/dL (ref 0.40–1.20)
GFR: 85.38 mL/min (ref >60.00)
GLUCOSE: 97 mg/dL (ref 70–99)
POTASSIUM: 3.7 meq/L (ref 3.5–5.1)
SODIUM: 140 meq/L (ref 135–145)
Total Bilirubin: 0.6 mg/dL (ref 0.2–1.2)
Total Protein: 6.4 g/dL (ref 6.0–8.3)

## 2015-06-15 LAB — FERRITIN: FERRITIN: 30 ng/mL (ref 10.0–291.0)

## 2015-06-15 LAB — TSH: TSH: 1.26 u[IU]/mL (ref 0.35–4.50)

## 2015-06-15 LAB — T4, FREE: Free T4: 0.85 ng/dL (ref 0.60–1.60)

## 2015-06-15 NOTE — Progress Notes (Signed)
Pre visit review using our clinic review tool, if applicable. No additional management support is needed unless otherwise documented below in the visit note. 

## 2015-06-15 NOTE — Patient Instructions (Signed)
We will check the labs today and call you back with the results.   We will also check the ultrasound of the neck to make sure that there is nothing larger than it should be.

## 2015-06-17 NOTE — Progress Notes (Signed)
   Subjective:    Patient ID: Danielle Chavez, female    DOB: 07/20/1955, 60 y.o.   MRN: [id number]  HPI The patient is a 60 YO female coming in for follow up of her feeling of throat discomfort. It feels like something is pushing up against her throat. Saw GI and had EGD which was without cause and no signs of GERD or ulcers. Saw ENT and they looked in her throat and did not see anything either. She is able to swallow and nothing gets blocked but there is that sensation and she wants to know about the cause. No change since onset about 1-2 months ago. No weight change or other symptoms new.   Review of Systems  Constitutional: Negative for fever, activity change, appetite change, fatigue and unexpected weight change.  HENT: Negative for trouble swallowing.        Discomfort in throat  Eyes: Negative.   Respiratory: Negative for cough, chest tightness, shortness of breath and wheezing.   Cardiovascular: Negative for chest pain, palpitations and leg swelling.  Gastrointestinal: Negative for nausea, abdominal pain, diarrhea, constipation and abdominal distention.  Musculoskeletal: Negative for myalgias, back pain, arthralgias and gait problem.  Skin: Negative.   Neurological: Negative for dizziness, syncope, weakness, light-headedness and headaches.  Psychiatric/Behavioral: Negative.       Objective:   Physical Exam  Constitutional: She is oriented to person, place, and time. She appears well-developed and well-nourished.  HENT:  Head: Normocephalic and atraumatic.  Eyes: EOM are normal.  Neck: Normal range of motion. Neck supple. No JVD present. No thyromegaly present.  Cardiovascular: Normal rate and regular rhythm.   Pulmonary/Chest: Effort normal and breath sounds normal. No respiratory distress. She has no wheezes. She has no rales.  Abdominal: Soft. She exhibits no distension. There is no tenderness. There is no rebound.  Neurological: She is alert and oriented to person,  place, and time. Coordination normal.  Skin: Skin is warm and dry.   Filed Vitals:   06/15/15 1601  BP: 116/62  Pulse: 98  Temp: 98.5 F (36.9 C)  TempSrc: Oral  Resp: 12  Height: 5' 4.5" (1.638 m)  Weight: 120 lb 12.8 oz (54.795 kg)  SpO2: 95%      Assessment & Plan:

## 2015-06-17 NOTE — Assessment & Plan Note (Signed)
No change since initiation of BID PPI about 1 month ago. Checking US soft tissue neck to rule out thyroid nodule or other mass. None appreciated on exam. EGD normal and ENT evaluation of throat normal. If no response to PPI in another month stop as GERD not likely the cause.

## 2015-06-20 ENCOUNTER — Encounter: Payer: Self-pay | Admitting: Podiatry

## 2015-06-20 ENCOUNTER — Ambulatory Visit
Admission: RE | Admit: 2015-06-20 | Discharge: 2015-06-20 | Disposition: A | Discharge status: Home / Self Care | Payer: BLUE CROSS/BLUE SHIELD | Source: Ambulatory Visit | Attending: Internal Medicine | Admitting: Internal Medicine

## 2015-06-20 DIAGNOSIS — R1310 Dysphagia, unspecified: Secondary | ICD-10-CM

## 2015-06-23 ENCOUNTER — Telehealth: Payer: Self-pay | Admitting: Internal Medicine

## 2015-06-23 NOTE — Telephone Encounter (Signed)
Pt request ultrasound result that was done 06/20/15. Please call her back today, she is hopping to get the result before the weekend.

## 2015-06-23 NOTE — Telephone Encounter (Signed)
Spoke with patient and gave her the [place] results.

## 2015-08-30 ENCOUNTER — Telehealth: Payer: Self-pay

## 2015-08-30 NOTE — Telephone Encounter (Signed)
Patient is on the list for [employer] 2017 and may be a good candidate for an AWV in 2017. Please let me know if/when appt is scheduled.   Note: Pt did have a physical in March.

## 2015-08-31 NOTE — Telephone Encounter (Signed)
Danielle Chavez, she is Nurse, mental health; Chiropractor;

## 2015-09-28 ENCOUNTER — Encounter: Payer: Self-pay | Admitting: Internal Medicine

## 2015-09-28 ENCOUNTER — Ambulatory Visit (INDEPENDENT_AMBULATORY_CARE_PROVIDER_SITE_OTHER): Payer: BLUE CROSS/BLUE SHIELD | Admitting: Internal Medicine

## 2015-09-28 VITALS — BP 118/80 | HR 80 | Ht 63.75 in | Wt 119.2 lb

## 2015-09-28 DIAGNOSIS — K219 Gastro-esophageal reflux disease without esophagitis: Secondary | ICD-10-CM

## 2015-09-28 DIAGNOSIS — F458 Other somatoform disorders: Secondary | ICD-10-CM

## 2015-09-28 DIAGNOSIS — R198 Other specified symptoms and signs involving the digestive system and abdomen: Secondary | ICD-10-CM

## 2015-09-28 DIAGNOSIS — R0989 Other specified symptoms and signs involving the circulatory and respiratory systems: Secondary | ICD-10-CM

## 2015-09-28 NOTE — Progress Notes (Signed)
HISTORY OF PRESENT ILLNESS:  Danielle Chavez is a 60 y.o. female with a history of GERD who is self-referred today regarding ongoing problems with globus sensation. The patient was evaluated in this office by the GI physician assistant 04/26/2015 with chief complaint of fullness sensation or lump-like sensation in the throat or esophagus. At that time her PPI was increased to twice daily. She was set up for upper endoscopy as well as colonoscopy (she was due). Both examinations were normal. I discussed globus sensation that day. She subsequently saw Danielle. Danielle Chavez, ENT. No abnormalities found. GERD surmised as the cause. Patient tells me that she has lump-like sensation in the throat constantly. Feels somewhat better with swallowing. She is worried about esophageal cancer. I mentioned different causes including anxiety. She does tell me that she stopped antidepressant medications over a year ago and this symptom began shortly thereafter. No other complaints  REVIEW OF SYSTEMS:  All non-GI ROS negative except for cough, night sweats, voice change, sore throat, sleeping problems, heart murmur  Past Medical History:  Diagnosis Date  . Cancer [facility]) 2010   breast, Danielle Chavez & Danielle Chavez  . Family history of breast cancer    MGM  . GERD (gastroesophageal reflux disease)   . Mitral valve prolapse   . Osteopenia     Past Surgical History:  Procedure Laterality Date  . APPENDECTOMY    . BREAST LUMPECTOMY  2010   LEFT; radiation  . FOOT SURGERY    . KNEE CARTILAGE SURGERY     age15    Social History Danielle Chavez  reports that she quit smoking about 33 years ago. She has never used smokeless tobacco. She reports that she drinks about 3.0 oz of alcohol per week . She reports that she does not use drugs.  family history includes Asthma in her mother; Bipolar disorder in her maternal aunt; Depression in her father and mother; Drug abuse in her son; Hypertension in her father and  mother; Osteoarthritis in her mother; Other in her father; Stroke in her maternal grandmother; Ulcers in her father and mother.  No Known Allergies     PHYSICAL EXAMINATION: Vital signs: BP 118/80 (BP Location: Left Arm, Patient Position: Sitting, Cuff Size: Normal)   Pulse 80   Ht 5' 3.75" (1.619 m) Comment: height measured without shoes  Wt 119 lb 4 oz (54.1 kg)   BMI 20.63 kg/m   Constitutional: generally well-appearing, no acute distress Psychiatric: alert and oriented x3, cooperative Eyes: extraocular movements intact, anicteric, conjunctiva pink Mouth: oral pharynx moist, no lesions. Normal posterior pharynx Neck: supple without thyromegaly Lymph: no supraclavicular lymphadenopathy Cardiovascular: heart regular rate and rhythm,  Lungs: clear to auscultation bilaterally Abdomen: soft, nontender, nondistended, no obvious ascites, no peritoneal signs, normal bowel sounds, no organomegaly Rectal:Omitted Extremities: no clubbing cyanosis or lower extremity edema bilaterally Skin: no lesions on visible extremities Neuro: No focal deficits. Cranial nerves intact  ASSESSMENT:  #1. Globus sensation. GI and ENT workups negative for organic problems. I think this is related to anxiety. I do not think this is related to GERD as she has not responded to high-dose PPI and her description is not consistent with GERD etiology   PLAN:  #1. Reassurance. Hopefully this will allow her symptom to resolve. If not she can talk to her PCP about antianxiety therapies #2. Reflux precautions #3. Lowest dose of PPI to control classic reflux symptoms would be appropriate #4. Surveillance colonoscopy 10 years #5. Interval GI follow-up as  needed  25 minutes was spent face-to-face with the patient. [place] 50% a time use for counseling regarding her globus sensation

## 2015-09-28 NOTE — Patient Instructions (Signed)
Please follow up with Dr. Perry as needed 

## 2015-10-09 ENCOUNTER — Other Ambulatory Visit: Payer: Self-pay | Admitting: Family Medicine

## 2015-10-09 DIAGNOSIS — E041 Nontoxic single thyroid nodule: Secondary | ICD-10-CM

## 2015-12-22 ENCOUNTER — Ambulatory Visit
Admission: RE | Admit: 2015-12-22 | Discharge: 2015-12-22 | Disposition: A | Discharge status: Home / Self Care | Payer: BLUE CROSS/BLUE SHIELD | Source: Ambulatory Visit | Attending: Family Medicine | Admitting: Family Medicine

## 2015-12-22 DIAGNOSIS — E041 Nontoxic single thyroid nodule: Secondary | ICD-10-CM

## 2016-04-10 ENCOUNTER — Ambulatory Visit (INDEPENDENT_AMBULATORY_CARE_PROVIDER_SITE_OTHER): Payer: BLUE CROSS/BLUE SHIELD | Admitting: Internal Medicine

## 2016-04-10 ENCOUNTER — Encounter: Payer: Self-pay | Admitting: Internal Medicine

## 2016-04-10 ENCOUNTER — Encounter (INDEPENDENT_AMBULATORY_CARE_PROVIDER_SITE_OTHER): Payer: Self-pay

## 2016-04-10 VITALS — BP 124/84 | HR 76 | Ht 63.75 in | Wt 123.1 lb

## 2016-04-10 DIAGNOSIS — R198 Other specified symptoms and signs involving the digestive system and abdomen: Secondary | ICD-10-CM

## 2016-04-10 DIAGNOSIS — F458 Other somatoform disorders: Secondary | ICD-10-CM

## 2016-04-10 DIAGNOSIS — K219 Gastro-esophageal reflux disease without esophagitis: Secondary | ICD-10-CM

## 2016-04-10 DIAGNOSIS — R0989 Other specified symptoms and signs involving the circulatory and respiratory systems: Secondary | ICD-10-CM

## 2016-04-10 MED ORDER — DEXLANSOPRAZOLE 60 MG PO CPDR: 60.0000 mg | DELAYED_RELEASE_CAPSULE | Freq: Every day | ORAL | 6 refills | Status: AC

## 2016-04-10 NOTE — Progress Notes (Signed)
HISTORY OF PRESENT ILLNESS:  Danielle Chavez is a 61 y.o. female with a history of breast cancer, GERD and anxiety who presents today with chronic persistent pharyngeal complaints including constant globus sensation, intermittent cough, and "scratchy voice". The patient was seen February of last year for the exact same complaints. She reports that these are no different. The most troubling complaint is a chronic want like sensation in the throat which is improved with swallowing. She has had negative ENT evaluation previously. She has been on pantoprazole 40 mg once or twice daily without much change. She did resume antidepressant therapy which did not help. She wonders about a different PPI. No classic reflux symptoms. No esophageal dysphagia. Upper endoscopy performed 05/17/2015 to evaluate these complaints was normal. She did undergo routine screening colonoscopy at that time. This was also normal. GI review of systems is otherwise negative except for mild weight gain of about 4 pounds in 1 year.  REVIEW OF SYSTEMS:  All non-GI ROS negative except for cough, sleeping problems, sore throat, voice change  Past Medical History:  Diagnosis Date  . Anxiety   . Cancer [facility]) 2010   breast, Dr Dalbert Batman & Dr Pablo Ledger  . Family history of breast cancer    MGM  . GERD (gastroesophageal reflux disease)   . Mitral valve prolapse   . Osteopenia     Past Surgical History:  Procedure Laterality Date  . APPENDECTOMY    . BREAST LUMPECTOMY  2010   LEFT; radiation  . FOOT SURGERY    . KNEE CARTILAGE SURGERY     age15    Social History Danielle Chavez  reports that she quit smoking about 34 years ago. She has never used smokeless tobacco. She reports that she drinks about 3.0 oz of alcohol per week . She reports that she does not use drugs.  family history includes Asthma in her mother; Bipolar disorder in her maternal aunt; Depression in her father and mother; Drug abuse in her son;  Hypertension in her father and mother; Osteoarthritis in her mother; Other in her father; Stroke in her maternal grandmother; Ulcers in her father and mother.  No Known Allergies     PHYSICAL EXAMINATION: Vital signs: BP 124/84 (BP Location: Left Arm, Patient Position: Sitting, Cuff Size: Normal)   Pulse 76   Ht 5' 3.75" (1.619 m)   Wt 123 lb 2 oz (55.8 kg)   BMI 21.30 kg/m   Constitutional: generally well-appearing, no acute distress Psychiatric: alert and oriented x3, cooperative Eyes: extraocular movements intact, anicteric, conjunctiva pink. No thrush Mouth: oral pharynx moist, no lesions Neck: supple without thyromegaly Lymph: no lymphadenopathy Cardiovascular: heart regular rate and rhythm, no murmur Lungs: clear to auscultation bilaterally Abdomen: soft, nontender, nondistended, no obvious ascites, no peritoneal signs, normal bowel sounds, no organomegaly Rectal:Omitted Extremities: no clubbing cyanosis or lower extremity edema bilaterally Skin: no lesions on visible extremities Neuro: No focal deficits. Cranial nerves intact  ASSESSMENT:  #1. Globus sensation. Suspect functional #2. History of GERD. Back on PPI #3. Normal EGD and colonoscopy March 2017  PLAN:  #1. Extensive discussion on globus sensation, its causes and treatment #2. Trial of different PPI per patient request. Prescribed excellent 60 mg daily #3. Reassurance #4. Routine colonoscopy for screening 2027. Interval follow-up as needed  25 minutes was spent face-to-face with the patient. Greater than 50% of the time was used for counseling regarding her complaint of globus sensation and its management

## 2016-04-10 NOTE — Patient Instructions (Signed)
We have sent the following medications to your pharmacy for you to pick up at your convenience:  Dexilant  

## 2016-04-30 ENCOUNTER — Other Ambulatory Visit: Payer: Self-pay | Admitting: Internal Medicine

## 2016-06-06 ENCOUNTER — Other Ambulatory Visit: Payer: Self-pay | Admitting: Obstetrics and Gynecology

## 2016-06-06 DIAGNOSIS — Z9889 Other specified postprocedural states: Secondary | ICD-10-CM

## 2016-06-06 DIAGNOSIS — R922 Inconclusive mammogram: Secondary | ICD-10-CM

## 2016-06-06 DIAGNOSIS — Z1231 Encounter for screening mammogram for malignant neoplasm of breast: Secondary | ICD-10-CM

## 2016-06-13 ENCOUNTER — Ambulatory Visit
Admission: RE | Admit: 2016-06-13 | Discharge: 2016-06-13 | Disposition: A | Discharge status: Home / Self Care | Payer: BLUE CROSS/BLUE SHIELD | Source: Ambulatory Visit | Attending: Obstetrics and Gynecology | Admitting: Obstetrics and Gynecology

## 2016-06-13 DIAGNOSIS — Z9889 Other specified postprocedural states: Secondary | ICD-10-CM

## 2016-06-13 DIAGNOSIS — R922 Inconclusive mammogram: Secondary | ICD-10-CM

## 2016-08-09 ENCOUNTER — Ambulatory Visit
Admission: RE | Admit: 2016-08-09 | Discharge: 2016-08-09 | Disposition: A | Discharge status: Home / Self Care | Payer: BLUE CROSS/BLUE SHIELD | Source: Ambulatory Visit | Attending: Family Medicine | Admitting: Family Medicine

## 2016-08-09 ENCOUNTER — Other Ambulatory Visit: Payer: Self-pay | Admitting: Family Medicine

## 2016-08-09 DIAGNOSIS — R053 Chronic cough: Secondary | ICD-10-CM

## 2016-08-09 DIAGNOSIS — R05 Cough: Secondary | ICD-10-CM

## 2017-01-14 ENCOUNTER — Ambulatory Visit (HOSPITAL_COMMUNITY)
Admission: EM | Admit: 2017-01-14 | Discharge: 2017-01-14 | Disposition: A | Discharge status: Home / Self Care | Payer: No Typology Code available for payment source | Attending: Internal Medicine | Admitting: Internal Medicine

## 2017-01-14 ENCOUNTER — Encounter (HOSPITAL_COMMUNITY): Payer: Self-pay | Admitting: Emergency Medicine

## 2017-01-14 DIAGNOSIS — H9202 Otalgia, left ear: Secondary | ICD-10-CM

## 2017-01-14 MED ORDER — FLUTICASONE PROPIONATE 50 MCG/ACT NA SUSP: 2.0000 | Freq: Every day | NASAL | 0 refills | Status: AC

## 2017-01-14 MED ORDER — CETIRIZINE HCL 10 MG PO TABS: 10.0000 mg | ORAL_TABLET | Freq: Every day | ORAL | 0 refills | Status: AC

## 2017-01-14 NOTE — ED Provider Notes (Signed)
[facility]    CSN: [id number] Arrival date & time: 01/14/17  1021     History   Chief Complaint Chief Complaint  Patient presents with  . Otalgia    HPI Danielle Chavez is a 61 y.o. female.   61 year old female comes in for 3 week history of left ear pain and fullness. Patient states she started with URI symptoms, which has resolved for the past 1-2 weeks, but otalgia continued. She states that laying down makes it worse. She recently traveled through [place] and noticed ear popping. Denies ear drainage. Occasional cotton swab use. Denies recent swimming. Denies fever, chills, night sweats. Denies cough, nasal congestion, rhinorrhea.        Past Medical History:  Diagnosis Date  . Anxiety   . Cancer [facility]) 2010   breast, Dr Dalbert Batman & Dr Pablo Ledger  . Family history of breast cancer    MGM  . GERD (gastroesophageal reflux disease)   . Mitral valve prolapse   . Osteopenia     Patient Active Problem List   Diagnosis Date Noted  . Routine general medical examination at a health care facility 05/16/2015  . Dysphagia 04/20/2015  . Cancer of left breast ([facility]) 12/25/2011  . COLONIC POLYPS 12/23/2007  . MITRAL VALVE PROLAPSE 12/23/2007  . GERD 12/23/2007  . Depression 02/10/2007    Past Surgical History:  Procedure Laterality Date  . APPENDECTOMY    . BREAST BIOPSY Left 07/08/2008  . BREAST LUMPECTOMY Left 08/29/2008   Radiation  . FOOT SURGERY    . KNEE CARTILAGE SURGERY     age15  . REDUCTION MAMMAPLASTY Right 08/29/2008    OB History    No data available       Home Medications    Prior to Admission medications   Medication Sig Start Date End Date Taking? Authorizing Provider  cetirizine (ZYRTEC) 10 MG tablet Take 1 tablet (10 mg total) daily by mouth. 01/14/17   Aundrey Elahi V, PA-C  citalopram (CELEXA) 20 MG tablet Take 20 mg by mouth daily. 02/18/16   [provider]  dexlansoprazole (DEXILANT) 60 MG capsule Take 1 capsule (60  mg total) by mouth daily. 04/10/16   Irene Shipper, MD  fluticasone [facility]) 50 MCG/ACT nasal spray Place 2 sprays daily into both nostrils. 01/14/17   Tasia Catchings, Theresa Dohrman V, PA-C  Multiple Vitamin (MULTIVITAMIN) capsule Take 1 capsule by mouth daily.      [provider]  pantoprazole (PROTONIX) 40 MG tablet Take 1 tablet (40 mg total) by mouth 2 (two) times daily. 04/26/15   [place], Orland Visconti S, PA-C  pantoprazole (PROTONIX) 40 MG tablet Take 1 tablet (40 mg total) by mouth daily. Yearly physical is due must see MD for refills 04/30/16   Biagio Borg, MD  traZODone (DESYREL) 50 MG tablet Take 50 mg by mouth at bedtime. 03/02/16   [provider]    Family History Family History  Problem Relation Age of Onset  . Bipolar disorder Maternal Aunt   . Asthma Mother   . Osteoarthritis Mother        Osteoporosis  . Ulcers Mother   . Hypertension Mother   . Depression Mother   . Hypertension Father   . Ulcers Father   . Other Father        AIDS  . Depression Father   . Stroke Maternal Grandmother        >65  . Breast cancer Maternal Grandmother 24  . Alcohol  abuse Unknown        MG uncle  . Drug abuse Son     Social History Social History   Tobacco Use  . Smoking status: Former Smoker    Last attempt to quit: 03/04/1982    Years since quitting: 34.8  . Smokeless tobacco: Never Used  . Tobacco comment: smoked 1979-1984  Substance Use Topics  . Alcohol use: Yes    Alcohol/week: 3.0 oz    Types: 5 Shots of liquor per week  . Drug use: No     Allergies   Patient has no known allergies.   Review of Systems Review of Systems  Reason unable to perform ROS: See HPI as above.     Physical Exam Triage Vital Signs ED Triage Vitals [01/14/17 1118]  Enc Vitals Group     BP 120/82     Pulse Rate 79     Resp 18     Temp 98.2 F (36.8 C)     Temp Source Oral     SpO2 97 %     Weight      Height      Head Circumference      Peak Flow      Pain Score      Pain Loc       Pain Edu?      Excl. in [place]?    No data found.  Updated Vital Signs BP 120/82 (BP Location: Right Arm)   Pulse 79   Temp 98.2 F (36.8 C) (Oral)   Resp 18   SpO2 97%   Physical Exam  Constitutional: She is oriented to person, place, and time. She appears well-developed and well-nourished. No distress.  HENT:  Head: Normocephalic and atraumatic.  Right Ear: Tympanic membrane, external ear and ear canal normal.  Left Ear: Tympanic membrane, external ear and ear canal normal.  Nose: Nose normal. Right sinus exhibits no maxillary sinus tenderness and no frontal sinus tenderness. Left sinus exhibits no maxillary sinus tenderness and no frontal sinus tenderness.  Mouth/Throat: Uvula is midline, oropharynx is clear and moist and mucous membranes are normal.  No tenderness on palpation of bilateral tragus. No swelling/drainage of the ear canal. Cerumen in right ear. TM normal bilaterally without erythema, bulging, retraction. No obvious mid ear effusion noted.   Eyes: Conjunctivae are normal. Pupils are equal, round, and reactive to light.  Neck: Normal range of motion. Neck supple.  Cardiovascular: Normal rate, regular rhythm and normal heart sounds. Exam reveals no gallop and no friction rub.  No murmur heard. Pulmonary/Chest: Effort normal and breath sounds normal. She has no decreased breath sounds. She has no wheezes. She has no rhonchi. She has no rales.  Lymphadenopathy:    She has no cervical adenopathy.  Neurological: She is alert and oriented to person, place, and time.  Skin: Skin is warm and dry.  Psychiatric: She has a normal mood and affect. Her behavior is normal. Judgment normal.     UC Treatments / Results  Labs (all labs ordered are listed, but only abnormal results are displayed) Labs Reviewed - No data to display  EKG  EKG Interpretation None       Radiology No results found.  Procedures Procedures (including critical care time)  Medications Ordered in  UC Medications - No data to display   Initial Impression / Assessment and Plan / UC Course  I have reviewed the triage vital signs and the nursing notes.  Pertinent labs & imaging  results that were available during my care of the patient were reviewed by me and considered in my medical decision making (see chart for details).    Discussed possible eustachian tube dysfunction causing symptoms. Start flonase and zyrtec. Follow up with PCP if symptoms not improving in 1-2 weeks. Patient expresses understanding and agrees to plan.   Final Clinical Impressions(s) / UC Diagnoses   Final diagnoses:  Otalgia of left ear    ED Discharge Orders        Ordered    fluticasone (FLONASE) 50 MCG/ACT nasal spray  Daily     01/14/17 1144    cetirizine (ZYRTEC) 10 MG tablet  Daily     01/14/17 1144        Ok Edwards, [place] 01/14/17 1151

## 2017-01-14 NOTE — ED Triage Notes (Signed)
Pt here with left ear pain and fullness

## 2017-01-14 NOTE — Discharge Instructions (Signed)
No signs of ear infection today. Symptoms could be due to ear canal swelling/irritation. Start flonase and zyrtec as directed. Tessalon for cough. You can use over the counter nasal saline rinse such as neti pot for nasal congestion. Keep hydrated, your urine should be clear to pale yellow in color. Follow up with PCP if symptoms not improving in 1-2 weeks.

## 2017-03-20 ENCOUNTER — Other Ambulatory Visit: Payer: Self-pay | Admitting: Obstetrics and Gynecology

## 2017-03-20 DIAGNOSIS — Z853 Personal history of malignant neoplasm of breast: Secondary | ICD-10-CM

## 2017-06-18 ENCOUNTER — Encounter: Payer: Self-pay | Admitting: Genetic Counselor

## 2017-06-18 ENCOUNTER — Ambulatory Visit (HOSPITAL_BASED_OUTPATIENT_CLINIC_OR_DEPARTMENT_OTHER): Payer: No Typology Code available for payment source | Admitting: Genetic Counselor

## 2017-06-18 ENCOUNTER — Ambulatory Visit
Admission: RE | Admit: 2017-06-18 | Discharge: 2017-06-18 | Disposition: A | Discharge status: Home / Self Care | Payer: No Typology Code available for payment source | Source: Ambulatory Visit | Attending: Obstetrics and Gynecology | Admitting: Obstetrics and Gynecology

## 2017-06-18 ENCOUNTER — Other Ambulatory Visit: Payer: Self-pay | Admitting: Obstetrics and Gynecology

## 2017-06-18 DIAGNOSIS — Z853 Personal history of malignant neoplasm of breast: Secondary | ICD-10-CM

## 2017-06-18 DIAGNOSIS — C50912 Malignant neoplasm of unspecified site of left female breast: Secondary | ICD-10-CM

## 2017-06-18 DIAGNOSIS — Z7183 Encounter for nonprocreative genetic counseling: Secondary | ICD-10-CM

## 2017-06-18 DIAGNOSIS — Z171 Estrogen receptor negative status [ER-]: Secondary | ICD-10-CM

## 2017-06-18 DIAGNOSIS — Z1231 Encounter for screening mammogram for malignant neoplasm of breast: Secondary | ICD-10-CM

## 2017-06-18 DIAGNOSIS — Z923 Personal history of irradiation: Secondary | ICD-10-CM

## 2017-06-18 DIAGNOSIS — Z803 Family history of malignant neoplasm of breast: Secondary | ICD-10-CM

## 2017-06-18 HISTORY — DX: Personal history of irradiation: Z92.3

## 2017-06-18 HISTORY — DX: Malignant neoplasm of unspecified site of unspecified female breast: C50.919

## 2017-06-18 NOTE — Progress Notes (Addendum)
REFERRING PROVIDER: Kelton Pillar, MD [place] [employer] [place], [place] [address]  PRIMARY PROVIDER:  Kelton Pillar, MD  PRIMARY REASON FOR VISIT:  1. Family history of breast cancer   2. History of breast cancer     HISTORY OF PRESENT ILLNESS:   Danielle Chavez, a 62 y.o. female, was seen for a [place] cancer genetics consultation at the request of Dr. Laurann Montana due to a personal and family history of cancer.  Danielle Chavez presents to clinic today to discuss the possibility of a hereditary predisposition to cancer, genetic testing, and to further clarify her future cancer risks, as well as potential cancer risks for family members.   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 This was treated with lumpectomy and radiation.  She declined taking tamoxifen. At that time Danielle Chavez underwent genetic testing which is reportedly negative.     CANCER HISTORY:   No history exists.      Past Medical History:  Diagnosis Date  . Anxiety   . Breast cancer ([place]) 2010   Left Breast Cancer  . Cancer [facility]) 2010   breast, Dr Dalbert Batman & Dr Pablo Ledger  . Family history of breast cancer    MGM  . Family history of breast cancer   . GERD (gastroesophageal reflux disease)   . Mitral valve prolapse   . Osteopenia   . Personal history of radiation therapy 2010    Past Surgical History:  Procedure Laterality Date  . APPENDECTOMY    . BREAST BIOPSY Left 07/08/2008  . BREAST LUMPECTOMY Left 08/29/2008   Radiation  . FOOT SURGERY    . KNEE CARTILAGE SURGERY     age15  . REDUCTION MAMMAPLASTY Right 08/29/2008    Social History   Socioeconomic History  . Marital status: Married    Spouse name: Not on file  . Number of children: Not on file  . Years of education: Not on file  . Highest education level: Not on file  Occupational History  . Not on file  Social Needs  . Financial resource strain: Not on file  . Food insecurity:     Worry: Not on file    Inability: Not on file  . Transportation needs:    Medical: Not on file    Non-medical: Not on file  Tobacco Use  . Smoking status: Former Smoker    Last attempt to quit: 03/04/1982    Years since quitting: 35.3  . Smokeless tobacco: Never Used  . Tobacco comment: smoked 1979-1984  Substance and Sexual Activity  . Alcohol use: Yes    Alcohol/week: 3.0 oz    Types: 5 Shots of liquor per week  . Drug use: No  . Sexual activity: Not on file  Lifestyle  . Physical activity:    Days per week: Not on file    Minutes per session: Not on file  . Stress: Not on file  Relationships  . Social connections:    Talks on phone: Not on file    Gets together: Not on file    Attends religious service: Not on file    Active member of club or organization: Not on file    Attends meetings of clubs or organizations: Not on file    Relationship status: Not on file  Other Topics Concern  . Not on file  Social History Narrative  . Not on file     FAMILY HISTORY:  We obtained a detailed, 4-generation family history.  Significant diagnoses are listed below: Family History  Problem Relation Age of Onset  . Bipolar disorder Maternal Aunt   . Asthma Mother   . Osteoarthritis Mother        Osteoporosis  . Ulcers Mother   . Hypertension Mother   . Depression Mother   . Hypertension Father   . Ulcers Father   . Other Father        AIDS  . Depression Father   . Breast cancer Sister 7  . Stroke Maternal Grandmother        >65  . Breast cancer Maternal Grandmother 47  . Alcohol abuse Unknown        MG uncle  . Drug abuse Son     The patient has two children, a son and daughter, who are cancer free.  She has one sister who was diagnosed with breast cancer at 58.  Her sister reportedly had genetic testing that was negative.  Her father is deceased and her mother is alive at 71.    The patient's mother had one sister who committed suicide at 17.  The grandmother had  breast cancer at 52 and the grandfather is deceased from non cancer related issues.  The patient's father died of HIV.  We do not have his family history.  Patient's ancestors are of [place] and [place] descent. There is no reported Ashkenazi Jewish ancestry. There is no known consanguinity.  GENETIC COUNSELING ASSESSMENT: Danielle Chavez is a 62 y.o. female with a personal and family history of breast cancer which is somewhat suggestive of a hereditary cancer syndrome and predisposition to cancer. We, therefore, discussed and recommended the following at today's visit.   DISCUSSION: We discussed that about 5-10% of breast cancer cases are due to hereditary causes, most commonly due to BRCA mutations.  Other genes that are associated with hereditary breast cancer syndromes include ATM, CHEK2 and PALB2.  We discussed that her negative testing in the past suggests that she is negative for a BRCA mutation, however there are some mutations that we could not identify at that time, but can now.  We reviewed the characteristics, features and inheritance patterns of hereditary cancer syndromes. We also discussed genetic testing, including the appropriate family members to test, the process of testing, insurance coverage and turn-around-time for results. We discussed the implications of a negative, positive and/or variant of uncertain significant result. We recommended Danielle Chavez pursue genetic testing for the common hereditary cancer gene panel. The Hereditary Gene Panel offered by [facility] includes sequencing and/or deletion duplication testing of the following 47 genes: APC, ATM, AXIN2, BARD1, BMPR1A, BRCA1, BRCA2, BRIP1, CDH1, CDK4, CDKN2A (p14ARF), CDKN2A (p16INK4a), CHEK2, CTNNA1, DICER1, EPCAM (Deletion/duplication testing only), GREM1 (promoter region deletion/duplication testing only), KIT, MEN1, MLH1, MSH2, MSH3, MSH6, MUTYH, NBN, NF1, NHTL1, PALB2, PDGFRA, PMS2, POLD1, POLE, PTEN, RAD50,  RAD51C, RAD51D, SDHB, SDHC, SDHD, SMAD4, SMARCA4. STK11, TP53, TSC1, TSC2, and VHL.  The following genes were evaluated for sequence changes only: SDHA and HOXB13 c.251G>A variant only.   Based on Danielle Chavez personal and family history of cancer, she meets medical criteria for genetic testing. Despite that she meets criteria, she may still have an out of pocket cost. We discussed that if her out of pocket cost for testing is over $100, the laboratory will call and confirm whether she wants to proceed with testing.  If the out of pocket cost of testing is less than $100  she will be billed by the genetic testing laboratory.   We discussed that some people do not want to undergo genetic testing due to fear of genetic discrimination.  A federal law called the Genetic Information Non-Discrimination Act (GINA) of 2008 helps protect individuals against genetic discrimination based on their genetic test results.  It impacts both health insurance and employment.  With health insurance, it protects against increased premiums, being kicked off insurance or being forced to take a test in order to be insured.  For employment it protects against hiring, firing and promoting decisions based on genetic test results.  Health status due to a cancer diagnosis is not protected under GINA.  PLAN: Despite our recommendation,Danielle Chavez did not wish to pursue genetic testing at today's visit. She wants to look at her life insurance before pursuing genetic testing.  We understand this decision, and remain available to coordinate genetic testing at any time in the future. We, therefore, recommend Danielle Chavez continue to follow the cancer screening guidelines given by her primary healthcare provider.   Danielle Chavez questions were answered to her satisfaction today. Our contact information was provided should additional questions or concerns arise.  Lastly, we encouraged Danielle Chavez to remain in contact with cancer  genetics annually so that we can continuously update the family history and inform her of any changes in cancer genetics and testing that may be of benefit for this family.   Ms.  Chavez questions were answered to her satisfaction today. Our contact information was provided should additional questions or concerns arise. Thank you for the referral and allowing [place] to share in the care of your patient.   Kennedy Brines P. Florene Glen, [place], [facility] Certified Genetic Counselor [email or web address] phone: [phone]  The patient was seen for a total of 35 minutes in face-to-face genetic counseling.  This patient was discussed with Drs. Magrinat, Lindi Adie and/or Burr Medico who agrees with the above.    _______________________________________________________________________ For Office Staff:  Number of people involved in session: 2 Was an Intern/ student involved with case: no

## 2017-07-18 ENCOUNTER — Telehealth: Payer: Self-pay | Admitting: Genetic Counselor

## 2017-07-18 DIAGNOSIS — Z853 Personal history of malignant neoplasm of breast: Secondary | ICD-10-CM | POA: Insufficient documentation

## 2017-07-18 NOTE — Telephone Encounter (Signed)
LM on VM about billing resubmission.  The billing code was corrected, and our billers have submitted the request to have the claim corrected.  The original claim has to process first before they can send the corrected claim.  This can sometimes take 3-4 weeks.  Asked for her to CB if she had any questions, and apologized for the confusion.

## 2017-07-18 NOTE — Progress Notes (Signed)
This note was addended to reflect that the patient was seen due to a personal history of breast cancer, and not a current diagnosis.

## 2018-07-28 ENCOUNTER — Other Ambulatory Visit: Payer: Self-pay | Admitting: Obstetrics and Gynecology

## 2018-07-28 DIAGNOSIS — Z1231 Encounter for screening mammogram for malignant neoplasm of breast: Secondary | ICD-10-CM

## 2018-09-14 ENCOUNTER — Ambulatory Visit
Admission: RE | Admit: 2018-09-14 | Discharge: 2018-09-14 | Disposition: A | Discharge status: Home / Self Care | Payer: PRIVATE HEALTH INSURANCE | Source: Ambulatory Visit | Attending: Obstetrics and Gynecology | Admitting: Obstetrics and Gynecology

## 2018-09-14 ENCOUNTER — Other Ambulatory Visit: Payer: Self-pay

## 2018-09-14 DIAGNOSIS — Z1231 Encounter for screening mammogram for malignant neoplasm of breast: Secondary | ICD-10-CM

## 2019-02-03 ENCOUNTER — Other Ambulatory Visit: Payer: Self-pay

## 2019-02-03 DIAGNOSIS — Z20822 Contact with and (suspected) exposure to covid-19: Secondary | ICD-10-CM

## 2019-02-05 LAB — NOVEL CORONAVIRUS, NAA: SARS-CoV-2, NAA: NOT DETECTED

## 2019-04-01 ENCOUNTER — Ambulatory Visit: Payer: PRIVATE HEALTH INSURANCE | Attending: Internal Medicine

## 2019-04-01 DIAGNOSIS — Z20822 Contact with and (suspected) exposure to covid-19: Secondary | ICD-10-CM

## 2019-04-02 LAB — NOVEL CORONAVIRUS, NAA: SARS-CoV-2, NAA: NOT DETECTED

## 2019-05-04 DIAGNOSIS — Z9049 Acquired absence of other specified parts of digestive tract: Secondary | ICD-10-CM | POA: Insufficient documentation

## 2019-10-21 ENCOUNTER — Encounter: Payer: Self-pay | Admitting: Genetic Counselor

## 2019-10-25 ENCOUNTER — Other Ambulatory Visit: Payer: Self-pay | Admitting: Obstetrics and Gynecology

## 2019-10-25 DIAGNOSIS — Z1231 Encounter for screening mammogram for malignant neoplasm of breast: Secondary | ICD-10-CM

## 2019-11-10 ENCOUNTER — Ambulatory Visit
Admission: RE | Admit: 2019-11-10 | Discharge: 2019-11-10 | Disposition: A | Discharge status: Home / Self Care | Payer: PRIVATE HEALTH INSURANCE | Source: Ambulatory Visit | Attending: Obstetrics and Gynecology | Admitting: Obstetrics and Gynecology

## 2019-11-10 ENCOUNTER — Other Ambulatory Visit: Payer: Self-pay

## 2019-11-10 DIAGNOSIS — Z1231 Encounter for screening mammogram for malignant neoplasm of breast: Secondary | ICD-10-CM

## 2020-02-02 ENCOUNTER — Ambulatory Visit: Payer: PRIVATE HEALTH INSURANCE | Admitting: Internal Medicine

## 2020-03-25 ENCOUNTER — Other Ambulatory Visit: Payer: PRIVATE HEALTH INSURANCE

## 2020-09-28 ENCOUNTER — Other Ambulatory Visit: Payer: Self-pay | Admitting: Obstetrics and Gynecology

## 2020-09-28 DIAGNOSIS — Z1231 Encounter for screening mammogram for malignant neoplasm of breast: Secondary | ICD-10-CM

## 2020-11-17 ENCOUNTER — Other Ambulatory Visit: Payer: Self-pay

## 2020-11-17 ENCOUNTER — Ambulatory Visit
Admission: RE | Admit: 2020-11-17 | Discharge: 2020-11-17 | Disposition: A | Discharge status: Home / Self Care | Payer: PRIVATE HEALTH INSURANCE | Source: Ambulatory Visit | Attending: Obstetrics and Gynecology | Admitting: Obstetrics and Gynecology

## 2020-11-17 DIAGNOSIS — Z1231 Encounter for screening mammogram for malignant neoplasm of breast: Secondary | ICD-10-CM

## 2021-05-30 DIAGNOSIS — R87610 Atypical squamous cells of undetermined significance on cytologic smear of cervix (ASC-US): Secondary | ICD-10-CM | POA: Insufficient documentation

## 2021-05-30 HISTORY — DX: Atypical squamous cells of undetermined significance on cytologic smear of cervix (ASC-US): R87.610

## 2021-10-15 ENCOUNTER — Other Ambulatory Visit: Payer: Self-pay | Admitting: Obstetrics and Gynecology

## 2021-10-15 DIAGNOSIS — Z1231 Encounter for screening mammogram for malignant neoplasm of breast: Secondary | ICD-10-CM

## 2021-10-24 DIAGNOSIS — M810 Age-related osteoporosis without current pathological fracture: Secondary | ICD-10-CM | POA: Diagnosis not present

## 2021-10-31 DIAGNOSIS — M818 Other osteoporosis without current pathological fracture: Secondary | ICD-10-CM | POA: Diagnosis not present

## 2021-11-14 DIAGNOSIS — E041 Nontoxic single thyroid nodule: Secondary | ICD-10-CM | POA: Diagnosis not present

## 2021-11-14 DIAGNOSIS — K219 Gastro-esophageal reflux disease without esophagitis: Secondary | ICD-10-CM | POA: Diagnosis not present

## 2021-11-14 DIAGNOSIS — M810 Age-related osteoporosis without current pathological fracture: Secondary | ICD-10-CM | POA: Diagnosis not present

## 2021-11-14 DIAGNOSIS — Z136 Encounter for screening for cardiovascular disorders: Secondary | ICD-10-CM | POA: Diagnosis not present

## 2021-11-14 DIAGNOSIS — Z0000 Encounter for general adult medical examination without abnormal findings: Secondary | ICD-10-CM | POA: Diagnosis not present

## 2021-11-14 DIAGNOSIS — Z1159 Encounter for screening for other viral diseases: Secondary | ICD-10-CM | POA: Diagnosis not present

## 2021-11-19 ENCOUNTER — Ambulatory Visit: Payer: PRIVATE HEALTH INSURANCE

## 2021-12-06 ENCOUNTER — Ambulatory Visit
Admission: RE | Admit: 2021-12-06 | Discharge: 2021-12-06 | Disposition: A | Discharge status: Home / Self Care | Payer: Medicare Other | Source: Ambulatory Visit | Attending: Obstetrics and Gynecology | Admitting: Obstetrics and Gynecology

## 2021-12-06 ENCOUNTER — Ambulatory Visit: Payer: PRIVATE HEALTH INSURANCE

## 2021-12-06 DIAGNOSIS — Z1231 Encounter for screening mammogram for malignant neoplasm of breast: Secondary | ICD-10-CM

## 2022-05-16 DIAGNOSIS — K080 Exfoliation of teeth due to systemic causes: Secondary | ICD-10-CM | POA: Diagnosis not present

## 2022-07-22 DIAGNOSIS — H5203 Hypermetropia, bilateral: Secondary | ICD-10-CM | POA: Diagnosis not present

## 2022-08-06 DIAGNOSIS — K080 Exfoliation of teeth due to systemic causes: Secondary | ICD-10-CM | POA: Diagnosis not present

## 2022-09-13 DIAGNOSIS — B0052 Herpesviral keratitis: Secondary | ICD-10-CM | POA: Diagnosis not present

## 2022-09-13 DIAGNOSIS — H16143 Punctate keratitis, bilateral: Secondary | ICD-10-CM | POA: Diagnosis not present

## 2022-09-20 DIAGNOSIS — B0052 Herpesviral keratitis: Secondary | ICD-10-CM | POA: Diagnosis not present

## 2022-10-28 DIAGNOSIS — Z124 Encounter for screening for malignant neoplasm of cervix: Secondary | ICD-10-CM | POA: Diagnosis not present

## 2022-10-28 DIAGNOSIS — Z6820 Body mass index (BMI) 20.0-20.9, adult: Secondary | ICD-10-CM | POA: Diagnosis not present

## 2022-10-28 DIAGNOSIS — R87610 Atypical squamous cells of undetermined significance on cytologic smear of cervix (ASC-US): Secondary | ICD-10-CM | POA: Diagnosis not present

## 2022-10-28 DIAGNOSIS — Z01419 Encounter for gynecological examination (general) (routine) without abnormal findings: Secondary | ICD-10-CM | POA: Diagnosis not present

## 2022-11-28 DIAGNOSIS — K080 Exfoliation of teeth due to systemic causes: Secondary | ICD-10-CM | POA: Diagnosis not present

## 2022-12-03 DIAGNOSIS — K080 Exfoliation of teeth due to systemic causes: Secondary | ICD-10-CM | POA: Diagnosis not present

## 2022-12-09 DIAGNOSIS — K080 Exfoliation of teeth due to systemic causes: Secondary | ICD-10-CM | POA: Diagnosis not present

## 2022-12-24 ENCOUNTER — Other Ambulatory Visit: Payer: Self-pay | Admitting: Obstetrics and Gynecology

## 2022-12-24 DIAGNOSIS — Z1231 Encounter for screening mammogram for malignant neoplasm of breast: Secondary | ICD-10-CM

## 2023-01-17 ENCOUNTER — Ambulatory Visit
Admission: RE | Admit: 2023-01-17 | Discharge: 2023-01-17 | Disposition: A | Discharge status: Home / Self Care | Payer: Medicare Other | Source: Ambulatory Visit | Attending: Obstetrics and Gynecology | Admitting: Obstetrics and Gynecology

## 2023-01-17 DIAGNOSIS — Z1231 Encounter for screening mammogram for malignant neoplasm of breast: Secondary | ICD-10-CM | POA: Diagnosis not present

## 2023-01-24 DIAGNOSIS — F39 Unspecified mood [affective] disorder: Secondary | ICD-10-CM | POA: Diagnosis not present

## 2023-01-24 DIAGNOSIS — R413 Other amnesia: Secondary | ICD-10-CM | POA: Diagnosis not present

## 2023-01-24 DIAGNOSIS — Z0000 Encounter for general adult medical examination without abnormal findings: Secondary | ICD-10-CM | POA: Diagnosis not present

## 2023-01-24 DIAGNOSIS — M810 Age-related osteoporosis without current pathological fracture: Secondary | ICD-10-CM | POA: Diagnosis not present

## 2023-01-24 DIAGNOSIS — E7800 Pure hypercholesterolemia, unspecified: Secondary | ICD-10-CM | POA: Diagnosis not present

## 2023-01-24 DIAGNOSIS — E041 Nontoxic single thyroid nodule: Secondary | ICD-10-CM | POA: Diagnosis not present

## 2023-01-24 DIAGNOSIS — Z23 Encounter for immunization: Secondary | ICD-10-CM | POA: Diagnosis not present

## 2023-01-27 ENCOUNTER — Encounter: Payer: Self-pay | Admitting: Physician Assistant

## 2023-02-26 IMAGING — MG MM DIGITAL SCREENING BILAT W/ TOMO AND CAD
8 series · 8 of 24 positions shown · non-contrast
Comparison: Previous exam(s).

CLINICAL DATA: Screening.

EXAM:
DIGITAL SCREENING BILATERAL MAMMOGRAM WITH TOMOSYNTHESIS AND CAD
TECHNIQUE: Bilateral screening digital craniocaudal and mediolateral oblique
mammograms were obtained. Bilateral screening digital breast
tomosynthesis was performed. The images were evaluated with
computer-aided detection.

[L CC synth-2D]
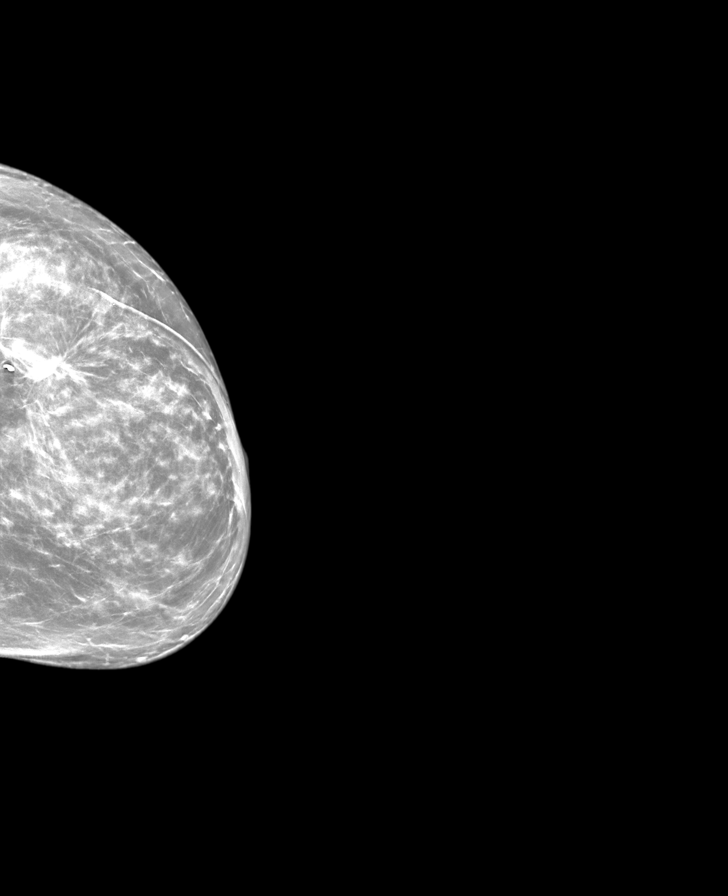

[L MLO synth-2D]
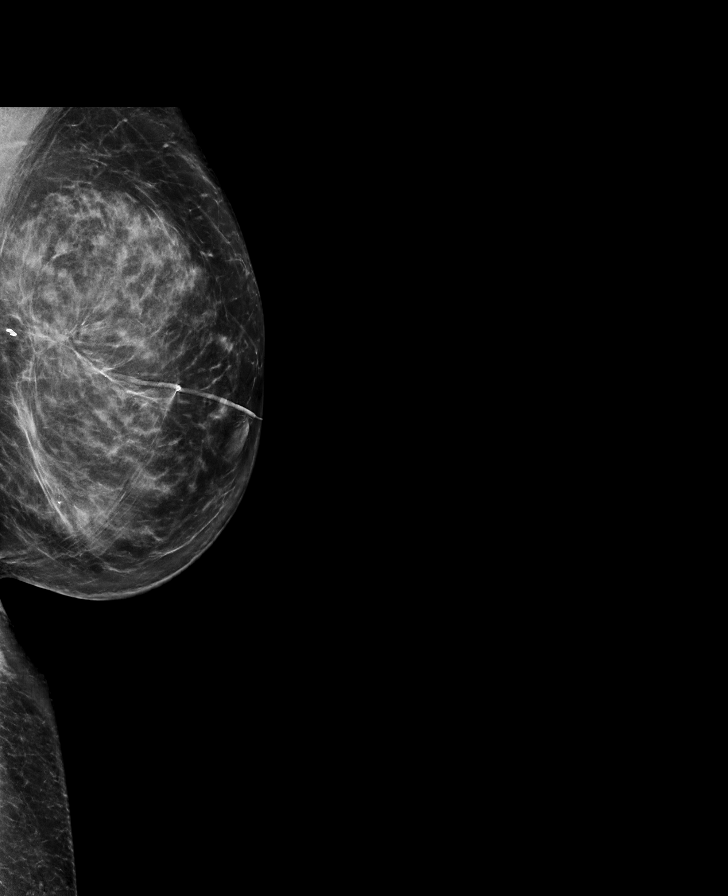

[R MLO synth-2D]
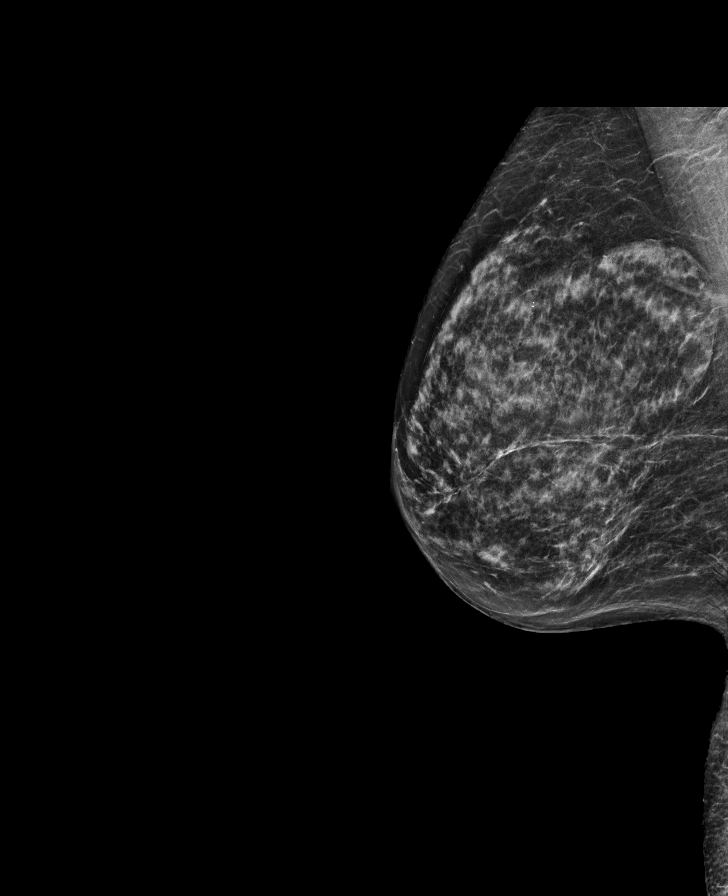

[R CC synth-2D]
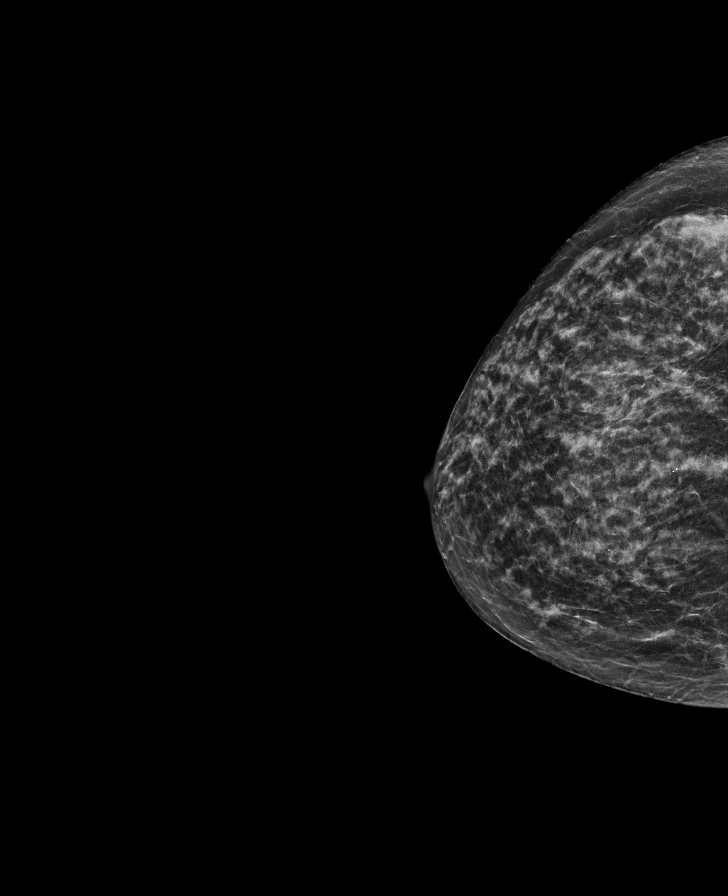

[R MLO tomo · tomo slice 29/58.0]
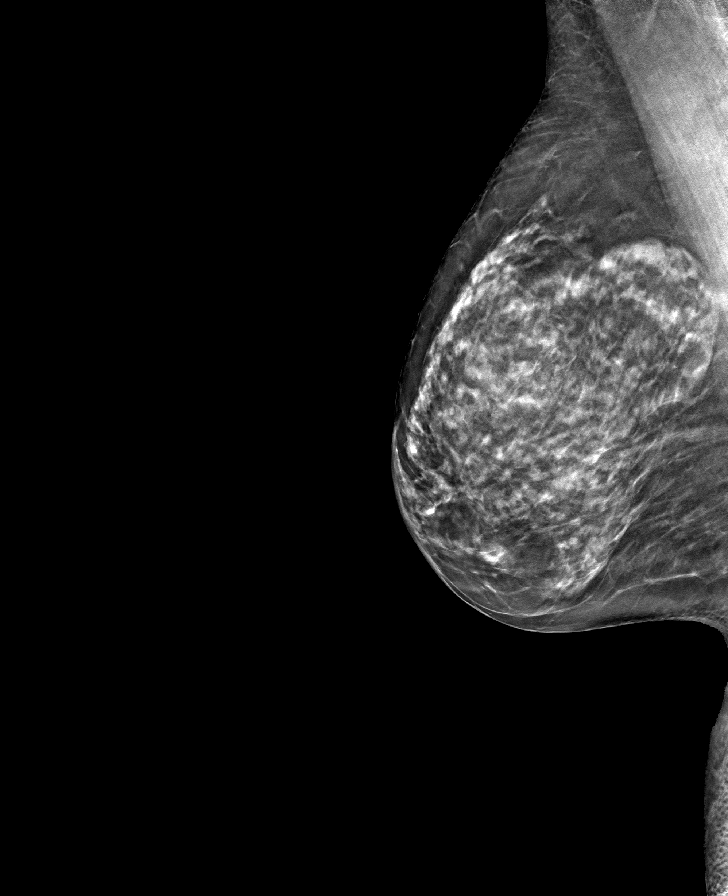

[L MLO tomo · tomo slice 39/77.0]
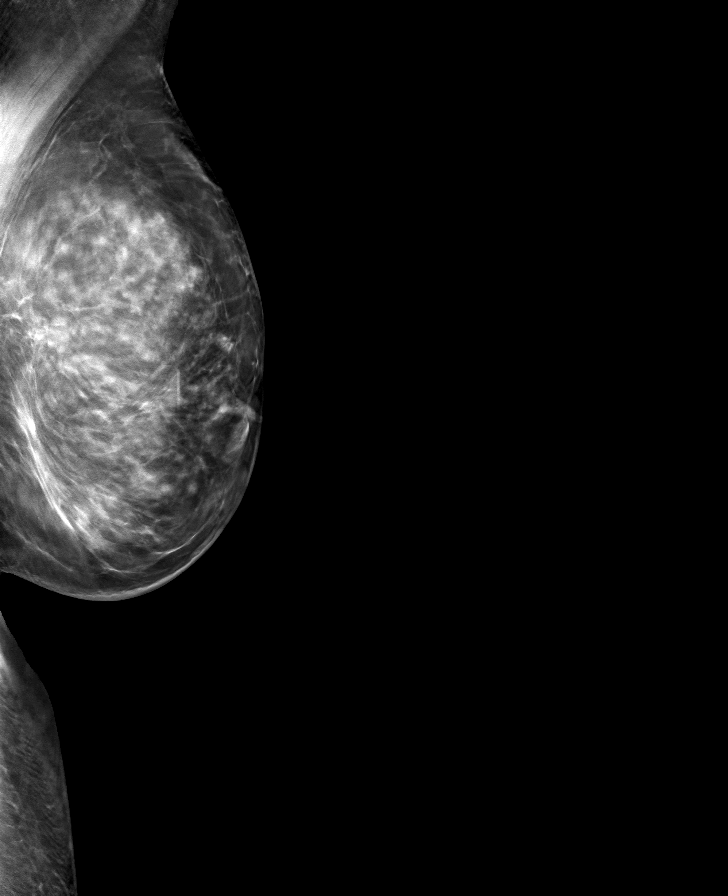

[R CC tomo · tomo slice 27/52.0]
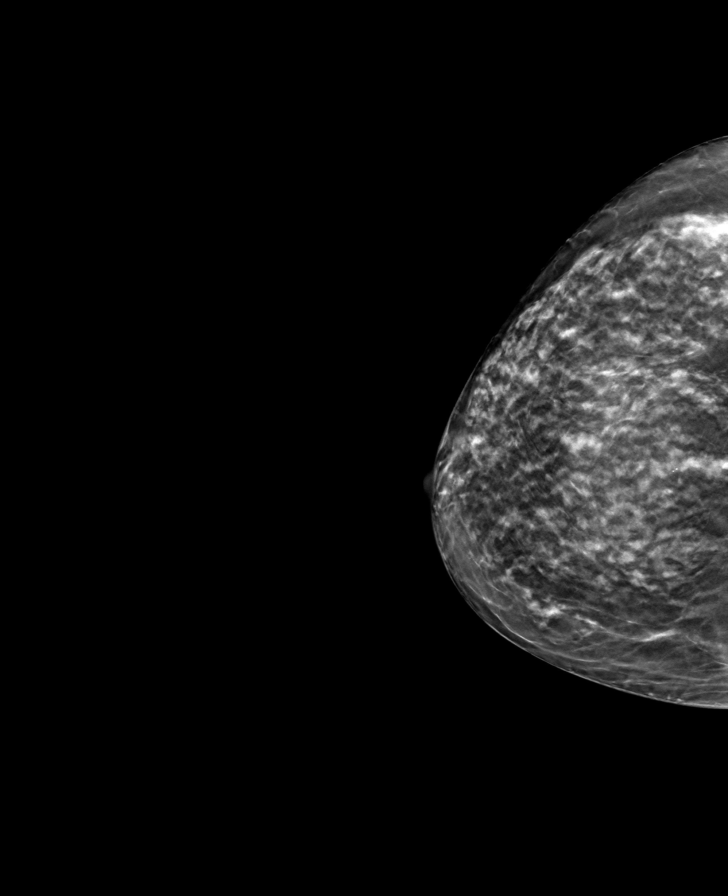

[L CC tomo · tomo slice 39/76.0]
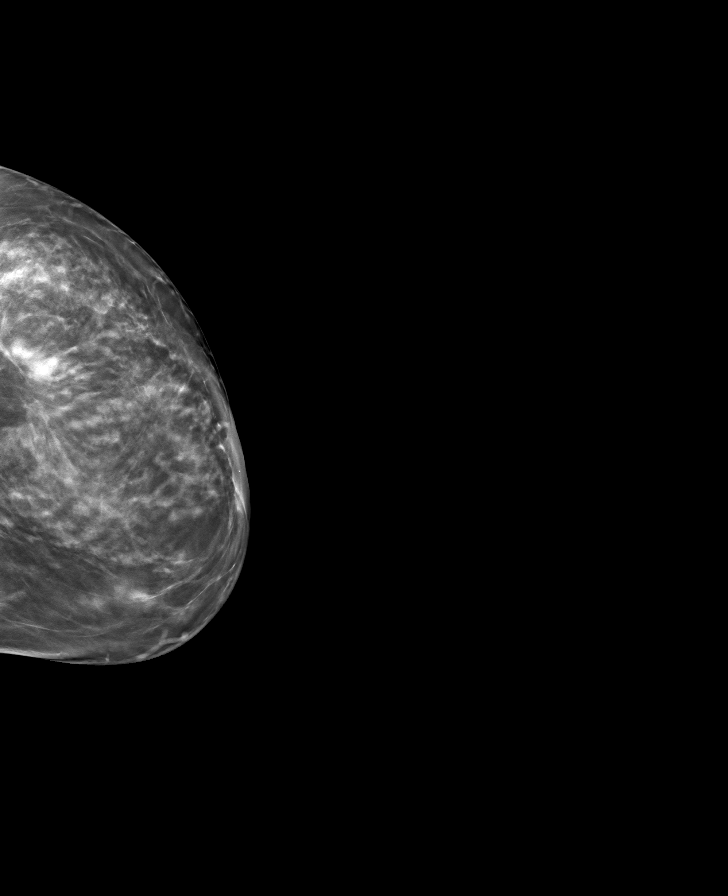

[8 of 24 positions shown; findings below may reference images not displayed]

ACR Breast Density Category c: The breast tissue is heterogeneously
dense, which may obscure small masses.
FINDINGS: There are no findings suspicious for malignancy.
IMPRESSION: No mammographic evidence of malignancy. A result letter of this
screening mammogram will be mailed directly to the patient.

RECOMMENDATION:
Screening mammogram in one year. (Code:Q3-W-BC3)

BI-RADS CATEGORY  1: Negative.

## 2023-02-28 NOTE — Progress Notes (Incomplete)
Assessment/Plan:   Danielle Chavez is a very pleasant 67 y.o. year old RH female with a history of thyroid nodule, GERD, HLD, history of breast cancer neg BRCA, insomnia, B12 deficiency  osteoporosis, situational depression seen today for evaluation of memory concerns MMSE at her PCP was 30/30. MoCA today is 28/30.  Patient is able to participate on his IADLs and continues to drive without significant difficulties. Given her young age, and history of dementia likely due to AD in her mother, will proceed with full workup for diagnostic clarity.     Memory difficulties of unclear etiology   MRI brain with and without contrast to assess for underlying structural abnormality and assess vascular load  Neurocognitive testing to further evaluate cognitive concerns and determine other underlying cause of memory changes, including potential contribution from sleep, anxiety, attention, or depression. LP  for formal diagnosis Continue  to replenish B12 (116). Check B1 No indications for antidementia meds at this time, will await for the proper testing results Continue to control mood as per PCP Agree with psychotherapy for situational depression. Recommend good control of cardiovascular risk factors Folllow up in 3 months   Subjective:   The patient is here alone   How long did patient have memory difficulties? "For the last year" especially with names of people that she knows.  Long-term memory is good. "Not so much difficulty remembering new information, conversations".  her husband does not notice any changes.  repeats oneself?  Once in a while Disoriented when walking into a room?  Patient denies  Leaving objects in unusual places? Denies.   Wandering behavior?  denies .  Any personality changes?  Denies.   Any history of depression?: Endorsed, due to issues with her son who experiences drug addiction  has sought psychotherapist about 2 years ago, may go back for therapy  again    Hallucinations or paranoia?  Denies   Seizures?  Denies    Any sleep changes?   Does not sleep well due to the above issues, takes trazodone intermittently, denies vivid dreams, REM behavior or sleepwalking   Sleep apnea?  Denies   Any hygiene concerns?  Denies   Independent of bathing and dressing?  Endorsed  Does the patient needs help with medications? Patient is in charge  Who is in charge of the finances? Patient is in charge    Any changes in appetite?  Denies    Patient have trouble swallowing? Denies.   Does the patient cook? Yes, denies forgetting common recipes.   Any kitchen accidents such as leaving the stove on? Denies.   Any history of headaches?  When she was a chil chocolate gave her headaches, but not as an adult..   Chronic pain ? Denies.   Ambulates with difficulty?  Denies. Walks her dog 2 times a day and does Pilates 4 times a week. Recent falls or head injuries? Denies.   Vision changes? Denies.   Unilateral weakness, numbness or tingling? Denies.   Any tremors?   Denies.   Any anosmia?  Denies.   Any incontinence of urine? Stress incontinence  Any bowel dysfunction? Denies.      Patient lives with husband.  History of heavy alcohol intake? 3 drinks a week  History of heavy tobacco use? Denies.   Family history of dementia? Mother with dementia 16's, suspicious for AD  Does patient drive? Yes,  denies getting lost   Labs: TSH 3.83, B12 very low 116.  Past Medical History:  Diagnosis Date   Anxiety    Breast cancer ([facility]) 2010   Left Breast Cancer   Cancer [facility]) 2010   breast, Dr Derrell Lolling & Dr Michell Heinrich   Family history of breast cancer    MGM   Family history of breast cancer    GERD (gastroesophageal reflux disease)    Mitral valve prolapse    Osteopenia    Personal history of radiation therapy 2010     Past Surgical History:  Procedure Laterality Date   APPENDECTOMY     BREAST BIOPSY Left 07/08/2008   BREAST LUMPECTOMY Left 08/29/2008    Radiation   FOOT SURGERY     KNEE CARTILAGE SURGERY     age15   REDUCTION MAMMAPLASTY Right 08/29/2008     No Known Allergies  Current Outpatient Medications  Medication Instructions   cetirizine (ZYRTEC) 10 mg, Oral, Daily   citalopram (CELEXA) 20 mg, Daily   dexlansoprazole (DEXILANT) 60 mg, Oral, Daily   fluticasone (FLONASE) 50 MCG/ACT nasal spray 2 sprays, Each Nare, Daily   Multiple Vitamin (MULTIVITAMIN) capsule 1 capsule, Daily   pantoprazole (PROTONIX) 40 mg, Oral, 2 times daily   pantoprazole (PROTONIX) 40 mg, Oral, Daily, Yearly physical is due must see MD for refills   traZODone (DESYREL) 50 mg, As needed     VITALS:  There were no vitals filed for this visit.    PHYSICAL EXAM   HEENT:  Normocephalic, atraumatic. The superficial temporal arteries are without ropiness or tenderness. Cardiovascular: Regular rate and rhythm. Lungs: Clear to auscultation bilaterally. Neck: There are no carotid bruits noted bilaterally.  NEUROLOGICAL:    03/03/2023    2:00 PM  Montreal Cognitive Assessment   Visuospatial/ Executive (0/5) 5  Naming (0/3) 3  Attention: Read list of digits (0/2) 2  Attention: Read list of letters (0/1) 1  Attention: Serial 7 subtraction starting at 100 (0/3) 3  Language: Repeat phrase (0/2) 1  Language : Fluency (0/1) 1  Abstraction (0/2) 2  Delayed Recall (0/5) 4  Orientation (0/6) 6  Total 28  Adjusted Score (based on education) 28        No data to display           Orientation:  Alert and oriented to person, place and time. No aphasia or dysarthria. Fund of knowledge is appropriate. Recent memory impaired and remote memory intact.  Attention and concentration are normal.  Able to name objects and repeat phrases. Delayed recall  4/5 Cranial nerves: There is good facial symmetry. Extraocular muscles are intact and visual fields are full to confrontational testing. Speech is fluent and clear. No tongue deviation. Hearing is intact to  conversational tone. Tone: Tone is good throughout. Sensation: Sensation is intact to light touch. Vibration is intact at the bilateral big toe.  Coordination: The patient has no difficulty with RAM's or FNF bilaterally. Normal finger to nose  Motor: Strength is 5/5 in the bilateral upper and lower extremities. There is no pronator drift. There are no fasciculations noted. DTR's: Deep tendon reflexes are 2/4 bilaterally. Gait and Station: The patient is able to ambulate without difficulty The patient is able to heel toe walk . Gait is cautious and narrow. The patient is able to ambulate in a tandem fashion.       Thank you for allowing [place] the opportunity to participate in the care of this nice patient. Please do not hesitate to contact us for any questions or concerns.   Total  time spent on today's visit was 60 minutes dedicated to this patient today, preparing to see patient, examining the patient, ordering tests and/or medications and counseling the patient, documenting clinical information in the EHR or other health record, independently interpreting results and communicating results to the patient/family, discussing treatment and goals, answering patient's questions and coordinating care.  Cc:  Ollen Bowl, MD  Marlowe Kays 03/03/2023 2:19 PM

## 2023-03-03 ENCOUNTER — Other Ambulatory Visit: Payer: Medicare Other

## 2023-03-03 ENCOUNTER — Encounter: Payer: Self-pay | Admitting: Physician Assistant

## 2023-03-03 ENCOUNTER — Ambulatory Visit: Payer: Medicare Other

## 2023-03-03 ENCOUNTER — Ambulatory Visit: Payer: Medicare Other | Admitting: Physician Assistant

## 2023-03-03 VITALS — BP 152/83

## 2023-03-03 DIAGNOSIS — Z853 Personal history of malignant neoplasm of breast: Secondary | ICD-10-CM | POA: Diagnosis not present

## 2023-03-03 DIAGNOSIS — R413 Other amnesia: Secondary | ICD-10-CM | POA: Diagnosis not present

## 2023-03-03 NOTE — Patient Instructions (Signed)
It was a pleasure to see you today at our office.   Recommendations:  Neurocognitive evaluation at our office   MRI of the brain, the radiology office will call you to arrange you appointment   Check labs today  Check LP  Follow up in March 20 at 11 am  Recommend visiting the website : " [facility]ementia [facility]" to better understand some behaviors related to memory loss.    For psychiatric meds, mood meds: Please have your primary care physician manage these medications.  If you have any severe symptoms of a stroke, or other severe issues such as confusion,severe chills or fever, etc call 911 or go to the ER as you may need to be evaluated further   For guidance regarding [facility] and if placement were needed at the facility, contact Social Worker tel: [phone]  For assessment of decision of mental capacity and competency:  Call Dr. Erick Blinks, geriatric psychiatrist at [phone]  Counseling regarding caregiver distress, including caregiver depression, anxiety and issues regarding community resources, adult day care programs, adult living facilities, or memory care questions:  please contact your  Primary Doctor's Social Worker   Whom to call: Memory  decline, memory medications: Call our office [phone]    [email or web address]   RECOMMENDATIONS FOR ALL PATIENTS WITH MEMORY PROBLEMS: 1. Continue to exercise (Recommend 30 minutes of walking everyday, or 3 hours every week) 2. Increase social interactions - continue going to [place] and enjoy social gatherings with friends and family 3. Eat healthy, avoid fried foods and eat more fruits and vegetables 4. Maintain adequate blood pressure, blood sugar, and blood cholesterol level. Reducing the risk of stroke and cardiovascular disease also helps promoting better memory. 5. Avoid stressful situations. Live a simple life and avoid aggravations. Organize  your time and prepare for the next day in anticipation. 6. Sleep well, avoid any interruptions of sleep and avoid any distractions in the bedroom that may interfere with adequate sleep quality 7. Avoid sugar, avoid sweets as there is a strong link between excessive sugar intake, diabetes, and cognitive impairment We discussed the Mediterranean diet, which has been shown to help patients reduce the risk of progressive memory disorders and reduces cardiovascular risk. This includes eating fish, eat fruits and green leafy vegetables, nuts like almonds and hazelnuts, walnuts, and also use olive oil. Avoid fast foods and fried foods as much as possible. Avoid sweets and sugar as sugar use has been linked to worsening of memory function.  There is always a concern of gradual progression of memory problems. If this is the case, then we may need to adjust level of care according to patient needs. Support, both to the patient and caregiver, should then be put into place.      You have been referred for a neuropsychological evaluation (i.e., evaluation of memory and thinking abilities). Please bring someone with you to this appointment if possible, as it is helpful for the doctor to hear from both you and another adult who knows you well. Please bring eyeglasses and hearing aids if you wear them.    The evaluation will take approximately 3 hours and has two parts:   The first part is a clinical interview with the neuropsychologist (Dr. Milbert Coulter or Dr. Roseanne Reno). During the interview, the neuropsychologist will speak with you and the individual you brought to the appointment.    The second part of the evaluation is testing with the doctor's technician Annabelle Harman or Selena Batten). During the testing,  the technician will ask you to remember different types of material, solve problems, and answer some questionnaires. Your family member will not be present for this portion of the evaluation.   Please note: We must reserve several  hours of the neuropsychologist's time and the psychometrician's time for your evaluation appointment. As such, there is a No-Show fee of $100. If you are unable to attend any of your appointments, please contact our office as soon as possible to reschedule.      DRIVING: Regarding driving, in patients with progressive memory problems, driving will be impaired. We advise to have someone else do the driving if trouble finding directions or if minor accidents are reported. Independent driving assessment is available to determine safety of driving.   If you are interested in the driving assessment, you can contact the following:  [facility] [employer] [facility] in [place] [phone]  [facility] [phone]  [facility] [phone]  [facility] [phone] or [phone]   FALL PRECAUTIONS: Be cautious when walking. Scan the area for obstacles that may increase the risk of trips and falls. When getting up in the mornings, sit up at the edge of the bed for a few minutes before getting out of bed. Consider elevating the bed at the head end to avoid drop of blood pressure when getting up. Walk always in a well-lit room (use night lights in the walls). Avoid area rugs or power cords from appliances in the middle of the walkways. Use a walker or a cane if necessary and consider physical therapy for balance exercise. Get your eyesight checked regularly.  FINANCIAL OVERSIGHT: Supervision, especially oversight when making financial decisions or transactions is also recommended.  HOME SAFETY: Consider the safety of the kitchen when operating appliances like stoves, microwave oven, and blender. Consider having supervision and share cooking responsibilities until no longer able to participate in those. Accidents with firearms and other hazards in the house should be identified and addressed as well.   ABILITY TO BE LEFT ALONE: If patient is unable to contact 911  operator, consider using [employer], or when the need is there, arrange for someone to stay with patients. Smoking is a fire hazard, consider supervision or cessation. Risk of wandering should be assessed by caregiver and if detected at any point, supervision and safe proof recommendations should be instituted.  MEDICATION SUPERVISION: Inability to self-administer medication needs to be constantly addressed. Implement a mechanism to ensure safe administration of the medications.      Mediterranean Diet A Mediterranean diet refers to food and lifestyle choices that are based on the traditions of countries located on the [place]. This way of eating has been shown to help prevent certain conditions and improve outcomes for people who have chronic diseases, like kidney disease and heart disease. What are tips for following this plan? Lifestyle  Cook and eat meals together with your family, when possible. Drink enough fluid to keep your urine clear or pale yellow. Be physically active every day. This includes: Aerobic exercise like running or swimming. Leisure activities like gardening, walking, or housework. Get 7-8 hours of sleep each night. If recommended by your health care provider, drink red wine in moderation. This means 1 glass a day for nonpregnant women and 2 glasses a day for men. A glass of wine equals 5 oz (150 mL). Reading food labels  Check the serving size of packaged foods. For foods such as rice and pasta, the serving size refers to the amount of  cooked product, not dry. Check the total fat in packaged foods. Avoid foods that have saturated fat or trans fats. Check the ingredients list for added sugars, such as corn syrup. Shopping  At the grocery store, buy most of your food from the areas near the walls of the store. This includes: Fresh fruits and vegetables (produce). Grains, beans, nuts, and seeds. Some of these may be available in unpackaged forms or large amounts  (in bulk). Fresh seafood. Poultry and eggs. Low-fat dairy products. Buy whole ingredients instead of prepackaged foods. Buy fresh fruits and vegetables in-season from local farmers markets. Buy frozen fruits and vegetables in resealable bags. If you do not have access to quality fresh seafood, buy precooked frozen shrimp or canned fish, such as tuna, salmon, or sardines. Buy small amounts of raw or cooked vegetables, salads, or olives from the deli or salad bar at your store. Stock your pantry so you always have certain foods on hand, such as olive oil, canned tuna, canned tomatoes, rice, pasta, and beans. Cooking  Cook foods with extra-virgin olive oil instead of using butter or other vegetable oils. Have meat as a side dish, and have vegetables or grains as your main dish. This means having meat in small portions or adding small amounts of meat to foods like pasta or stew. Use beans or vegetables instead of meat in common dishes like chili or lasagna. Experiment with different cooking methods. Try roasting or broiling vegetables instead of steaming or sauteing them. Add frozen vegetables to soups, stews, pasta, or rice. Add nuts or seeds for added healthy fat at each meal. You can add these to yogurt, salads, or vegetable dishes. Marinate fish or vegetables using olive oil, lemon juice, garlic, and fresh herbs. Meal planning  Plan to eat 1 vegetarian meal one day each week. Try to work up to 2 vegetarian meals, if possible. Eat seafood 2 or more times a week. Have healthy snacks readily available, such as: Vegetable sticks with hummus. Greek yogurt. Fruit and nut trail mix. Eat balanced meals throughout the week. This includes: Fruit: 2-3 servings a day Vegetables: 4-5 servings a day Low-fat dairy: 2 servings a day Fish, poultry, or lean meat: 1 serving a day Beans and legumes: 2 or more servings a week Nuts and seeds: 1-2 servings a day Whole grains: 6-8 servings a  day Extra-virgin olive oil: 3-4 servings a day Limit red meat and sweets to only a few servings a month What are my food choices? Mediterranean diet Recommended Grains: Whole-grain pasta. Brown rice. Bulgar wheat. Polenta. Couscous. Whole-wheat bread. Orpah Cobb. Vegetables: Artichokes. Beets. Broccoli. Cabbage. Carrots. Eggplant. Green beans. Chard. Kale. Spinach. Onions. Leeks. Peas. Squash. Tomatoes. Peppers. Radishes. Fruits: Apples. Apricots. Avocado. Berries. Bananas. Cherries. Dates. Figs. Grapes. Lemons. Melon. Oranges. Peaches. Plums. Pomegranate. Meats and other protein foods: Beans. Almonds. Sunflower seeds. Pine nuts. Peanuts. Cod. Salmon. Scallops. Shrimp. Tuna. Tilapia. Clams. Oysters. Eggs. Dairy: Low-fat milk. Cheese. Greek yogurt. Beverages: Water. Red wine. Herbal tea. Fats and oils: Extra virgin olive oil. Avocado oil. Grape seed oil. Sweets and desserts: [place] yogurt with honey. Baked apples. Poached pears. Trail mix. Seasoning and other foods: Basil. Cilantro. Coriander. Cumin. Mint. Parsley. Sage. Rosemary. Tarragon. Garlic. Oregano. Thyme. Pepper. Balsalmic vinegar. Tahini. Hummus. Tomato sauce. Olives. Mushrooms. Limit these Grains: Prepackaged pasta or rice dishes. Prepackaged cereal with added sugar. Vegetables: Deep fried potatoes (french fries). Fruits: Fruit canned in syrup. Meats and other protein foods: Beef. Pork. Lamb. Poultry with skin. Hot dogs.  Tomasa Blase. Dairy: Ice cream. Sour cream. Whole milk. Beverages: Juice. Sugar-sweetened soft drinks. Beer. Liquor and spirits. Fats and oils: Butter. Canola oil. Vegetable oil. Beef fat (tallow). Lard. Sweets and desserts: Cookies. Cakes. Pies. Candy. Seasoning and other foods: Mayonnaise. Premade sauces and marinades. The items listed may not be a complete list. Talk with your dietitian about what dietary choices are right for you. Summary The Mediterranean diet includes both food and lifestyle choices. Eat a  variety of fresh fruits and vegetables, beans, nuts, seeds, and whole grains. Limit the amount of red meat and sweets that you eat. Talk with your health care provider about whether it is safe for you to drink red wine in moderation. This means 1 glass a day for nonpregnant women and 2 glasses a day for men. A glass of wine equals 5 oz (150 mL). This information is not intended to replace advice given to you by your health care provider. Make sure you discuss any questions you have with your health care provider. Document Released: 10/12/2015 Document Revised: 11/14/2015 Document Reviewed: 10/12/2015 Elsevier Interactive Patient Education  2017 [employer].

## 2023-03-07 LAB — VITAMIN B1: Vitamin B1 (Thiamine): 13 nmol/L (ref 8–30)

## 2023-03-25 ENCOUNTER — Encounter: Payer: Self-pay | Admitting: Physician Assistant

## 2023-04-08 ENCOUNTER — Ambulatory Visit
Admission: RE | Admit: 2023-04-08 | Discharge: 2023-04-08 | Disposition: A | Discharge status: Home / Self Care | Payer: Medicare Other | Source: Ambulatory Visit | Attending: Physician Assistant | Admitting: Physician Assistant

## 2023-04-08 DIAGNOSIS — G319 Degenerative disease of nervous system, unspecified: Secondary | ICD-10-CM | POA: Diagnosis not present

## 2023-04-08 DIAGNOSIS — Z853 Personal history of malignant neoplasm of breast: Secondary | ICD-10-CM

## 2023-04-08 DIAGNOSIS — R413 Other amnesia: Secondary | ICD-10-CM

## 2023-04-08 DIAGNOSIS — I6782 Cerebral ischemia: Secondary | ICD-10-CM | POA: Diagnosis not present

## 2023-04-08 MED ORDER — GADOPICLENOL 0.5 MMOL/ML IV SOLN
5.0000 mL | Freq: Once | INTRAVENOUS | Status: AC | PRN
  Administered 2023-04-08: 5 mL via INTRAVENOUS

## 2023-04-09 NOTE — Progress Notes (Signed)
The MRI of the brain  showed mild hardening of the small blood vessels in the brain in patients with high cholesterol, blood pressure or sugar control issues, sometimes age as well. It did not show any tumors, stroke or bleed.Thanks

## 2023-04-09 NOTE — Progress Notes (Signed)
 No answer at 3:50pm 04/09/2023

## 2023-04-10 NOTE — Progress Notes (Signed)
 I advised patient of results of MRI, has an appt March 20,2025 to discuss. Patient verbalized understanding and thanked me for calling.

## 2023-05-03 DIAGNOSIS — H1032 Unspecified acute conjunctivitis, left eye: Secondary | ICD-10-CM | POA: Diagnosis not present

## 2023-05-22 ENCOUNTER — Encounter: Payer: Self-pay | Admitting: Physician Assistant

## 2023-05-22 ENCOUNTER — Ambulatory Visit: Payer: Medicare Other | Admitting: Physician Assistant

## 2023-05-22 VITALS — BP 110/60 | HR 68 | Resp 18

## 2023-05-22 DIAGNOSIS — R413 Other amnesia: Secondary | ICD-10-CM | POA: Insufficient documentation

## 2023-05-22 NOTE — Progress Notes (Signed)
 Assessment/Plan:   Memory difficulties of unclear etiology  Danielle Chavez is a very pleasant 68 y.o. RH female seen today in follow up to discuss the results of the studies performed including MRI of the brain, personally reviewed was remarkable for mild generalized cerebral atrophy, with minimal chronic small vessel ischemic changes within the cerebral white matter, without any acute intracranial abnormalities, no evidence of metastatic disease.  She continues to take B12 supplements which are beneficial to her.  At this point there is no indication for antidementia medication unless indicated by neuropsych evaluation scheduled later this year. Patient remains active, able to participate in her ADLs without any difficulties, and continues to drive.    Follow up in 3  months. Will order plasma biomarkers to further investigate the possibility of AD given her family history  Patient is scheduled for neuropsych evaluation for diagnostic clarity in the near future Continue to control mood as per PCP Recommend good control of cardiovascular risk factors Agree with psychotherapy for situational depression No indication for antidementia medications at this time   Continue to replenish B12, B1 in the lower normal, recommend replenishment    Initial visit 03/03/2023 How long did patient have memory difficulties? "For the last year" especially with names of people that she knows.  Long-term memory is good. "Not so much difficulty remembering new information, conversations".  her husband does not notice any changes.  repeats oneself?  Once in a while Disoriented when walking into a room?  Patient denies  Leaving objects in unusual places? Denies.   Wandering behavior?  denies .  Any personality changes?  Denies.   Any history of depression?: Endorsed, due to issues with her son who experiences drug addiction  has sought psychotherapist about 2 years ago, may go back for therapy  again    Hallucinations or paranoia?  Denies   Seizures?  Denies    Any sleep changes?   Does not sleep well due to the above issues, takes trazodone intermittently, denies vivid dreams, REM behavior or sleepwalking   Sleep apnea?  Denies   Any hygiene concerns?  Denies   Independent of bathing and dressing?  Endorsed  Does the patient needs help with medications? Patient is in charge  Who is in charge of the finances? Patient is in charge    Any changes in appetite?  Denies    Patient have trouble swallowing? Denies.   Does the patient cook? Yes, denies forgetting common recipes.   Any kitchen accidents such as leaving the stove on? Denies.   Any history of headaches?  When she was a chil chocolate gave her headaches, but not as an adult..   Chronic pain ? Denies.   Ambulates with difficulty?  Denies. Walks her dog 2 times a day and does Pilates 4 times a week. Recent falls or head injuries? Denies.   Vision changes? Denies.   Unilateral weakness, numbness or tingling? Denies.   Any tremors?   Denies.   Any anosmia?  Denies.   Any incontinence of urine? Stress incontinence  Any bowel dysfunction? Denies.      Patient lives with husband.  History of heavy alcohol intake? 3 drinks a week  History of heavy tobacco use? Denies.   Family history of dementia? Mother with dementia 40's, suspicious for AD  Does patient drive? Yes,  denies getting lost   CURRENT MEDICATIONS:  Outpatient Encounter Medications as of 05/22/2023  Medication Sig   cetirizine (ZYRTEC) 10 MG  tablet Take 1 tablet (10 mg total) daily by mouth.   citalopram (CELEXA) 20 MG tablet Take 20 mg by mouth daily. (Patient not taking: Reported on 03/03/2023)   dexlansoprazole (DEXILANT) 60 MG capsule Take 1 capsule (60 mg total) by mouth daily. (Patient not taking: Reported on 03/03/2023)   fluticasone (FLONASE) 50 MCG/ACT nasal spray Place 2 sprays daily into both nostrils.   Multiple Vitamin (MULTIVITAMIN) capsule Take 1  capsule by mouth daily.     pantoprazole (PROTONIX) 40 MG tablet Take 1 tablet (40 mg total) by mouth 2 (two) times daily. (Patient not taking: Reported on 03/03/2023)   pantoprazole (PROTONIX) 40 MG tablet Take 1 tablet (40 mg total) by mouth daily. Yearly physical is due must see MD for refills (Patient taking differently: Take 20 mg by mouth daily. Yearly physical is due must see MD for refills)   traZODone (DESYREL) 50 MG tablet Take 50 mg by mouth as needed.   No facility-administered encounter medications on file as of 05/22/2023.        No data to display            03/03/2023    2:00 PM  Montreal Cognitive Assessment   Visuospatial/ Executive (0/5) 5  Naming (0/3) 3  Attention: Read list of digits (0/2) 2  Attention: Read list of letters (0/1) 1  Attention: Serial 7 subtraction starting at 100 (0/3) 3  Language: Repeat phrase (0/2) 1  Language : Fluency (0/1) 1  Abstraction (0/2) 2  Delayed Recall (0/5) 4  Orientation (0/6) 6  Total 28  Adjusted Score (based on education) 28   Thank you for allowing [place] the opportunity to participate in the care of this nice patient. Please do not hesitate to contact us for any questions or concerns.   Total time spent on today's visit was 27 minutes dedicated to this patient today, preparing to see patient, examining the patient, ordering tests and/or medications and counseling the patient, documenting clinical information in the EHR or other health record, independently interpreting results and communicating results to the patient/family, discussing treatment and goals, answering patient's questions and coordinating care.  Cc:  Ollen Bowl, MD  Marlowe Kays 05/22/2023 6:25 AM

## 2023-05-22 NOTE — Patient Instructions (Signed)
 It was a pleasure to see you today at our office.   Recommendations:  Neurocognitive evaluation at our office   Check labs [address] Follow up late July    For psychiatric meds, mood meds: Please have your primary care physician manage these medications.  If you have any severe symptoms of a stroke, or other severe issues such as confusion,severe chills or fever, etc call 911 or go to the ER as you may need to be evaluated further   For guidance regarding [facility] and if placement were needed at the facility, contact Social Worker tel: [phone]  For assessment of decision of mental capacity and competency:  Call Dr. Erick Blinks, geriatric psychiatrist at [phone]  Counseling regarding caregiver distress, including caregiver depression, anxiety and issues regarding community resources, adult day care programs, adult living facilities, or memory care questions:  please contact your  Primary Doctor's Social Worker   Whom to call: Memory  decline, memory medications: Call our office [phone]    [email or web address]   RECOMMENDATIONS FOR ALL PATIENTS WITH MEMORY PROBLEMS: 1. Continue to exercise (Recommend 30 minutes of walking everyday, or 3 hours every week) 2. Increase social interactions - continue going to [place] and enjoy social gatherings with friends and family 3. Eat healthy, avoid fried foods and eat more fruits and vegetables 4. Maintain adequate blood pressure, blood sugar, and blood cholesterol level. Reducing the risk of stroke and cardiovascular disease also helps promoting better memory. 5. Avoid stressful situations. Live a simple life and avoid aggravations. Organize your time and prepare for the next day in anticipation. 6. Sleep well, avoid any interruptions of sleep and avoid any distractions in the bedroom that may interfere with adequate sleep quality 7. Avoid sugar, avoid  sweets as there is a strong link between excessive sugar intake, diabetes, and cognitive impairment We discussed the Mediterranean diet, which has been shown to help patients reduce the risk of progressive memory disorders and reduces cardiovascular risk. This includes eating fish, eat fruits and green leafy vegetables, nuts like almonds and hazelnuts, walnuts, and also use olive oil. Avoid fast foods and fried foods as much as possible. Avoid sweets and sugar as sugar use has been linked to worsening of memory function.  There is always a concern of gradual progression of memory problems. If this is the case, then we may need to adjust level of care according to patient needs. Support, both to the patient and caregiver, should then be put into place.      You have been referred for a neuropsychological evaluation (i.e., evaluation of memory and thinking abilities). Please bring someone with you to this appointment if possible, as it is helpful for the doctor to hear from both you and another adult who knows you well. Please bring eyeglasses and hearing aids if you wear them.    The evaluation will take approximately 3 hours and has two parts:   The first part is a clinical interview with the neuropsychologist (Dr. Milbert Coulter or Dr. Roseanne Reno). During the interview, the neuropsychologist will speak with you and the individual you brought to the appointment.    The second part of the evaluation is testing with the doctor's technician Annabelle Harman or Selena Batten). During the testing, the technician will ask you to remember different types of material, solve problems, and answer some questionnaires. Your family member will not be present for this portion of the evaluation.   Please note: We must reserve several hours of the neuropsychologist's  time and the psychometrician's time for your evaluation appointment. As such, there is a No-Show fee of $100. If you are unable to attend any of your appointments, please contact our  office as soon as possible to reschedule.      DRIVING: Regarding driving, in patients with progressive memory problems, driving will be impaired. We advise to have someone else do the driving if trouble finding directions or if minor accidents are reported. Independent driving assessment is available to determine safety of driving.   If you are interested in the driving assessment, you can contact the following:  [facility] in [place] [phone]  [facility] [phone]  [facility] [phone]  [facility] [phone] or [phone]   FALL PRECAUTIONS: Be cautious when walking. Scan the area for obstacles that may increase the risk of trips and falls. When getting up in the mornings, sit up at the edge of the bed for a few minutes before getting out of bed. Consider elevating the bed at the head end to avoid drop of blood pressure when getting up. Walk always in a well-lit room (use night lights in the walls). Avoid area rugs or power cords from appliances in the middle of the walkways. Use a walker or a cane if necessary and consider physical therapy for balance exercise. Get your eyesight checked regularly.  FINANCIAL OVERSIGHT: Supervision, especially oversight when making financial decisions or transactions is also recommended.  HOME SAFETY: Consider the safety of the kitchen when operating appliances like stoves, microwave oven, and blender. Consider having supervision and share cooking responsibilities until no longer able to participate in those. Accidents with firearms and other hazards in the house should be identified and addressed as well.   ABILITY TO BE LEFT ALONE: If patient is unable to contact 911 operator, consider using [employer], or when the need is there, arrange for someone to stay with patients. Smoking is a fire hazard, consider supervision or cessation. Risk of wandering should be assessed by caregiver and if  detected at any point, supervision and safe proof recommendations should be instituted.  MEDICATION SUPERVISION: Inability to self-administer medication needs to be constantly addressed. Implement a mechanism to ensure safe administration of the medications.      Mediterranean Diet A Mediterranean diet refers to food and lifestyle choices that are based on the traditions of countries located on the [employer] [place]. This way of eating has been shown to help prevent certain conditions and improve outcomes for people who have chronic diseases, like kidney disease and heart disease. What are tips for following this plan? Lifestyle  Cook and eat meals together with your family, when possible. Drink enough fluid to keep your urine clear or pale yellow. Be physically active every day. This includes: Aerobic exercise like running or swimming. Leisure activities like gardening, walking, or housework. Get 7-8 hours of sleep each night. If recommended by your health care provider, drink red wine in moderation. This means 1 glass a day for nonpregnant women and 2 glasses a day for men. A glass of wine equals 5 oz (150 mL). Reading food labels  Check the serving size of packaged foods. For foods such as rice and pasta, the serving size refers to the amount of cooked product, not dry. Check the total fat in packaged foods. Avoid foods that have saturated fat or trans fats. Check the ingredients list for added sugars, such as corn syrup. Shopping  At the grocery store, buy most of your food  from the areas near the walls of the store. This includes: Fresh fruits and vegetables (produce). Grains, beans, nuts, and seeds. Some of these may be available in unpackaged forms or large amounts (in bulk). Fresh seafood. Poultry and eggs. Low-fat dairy products. Buy whole ingredients instead of prepackaged foods. Buy fresh fruits and vegetables in-season from local farmers markets. Buy frozen fruits and  vegetables in resealable bags. If you do not have access to quality fresh seafood, buy precooked frozen shrimp or canned fish, such as tuna, salmon, or sardines. Buy small amounts of raw or cooked vegetables, salads, or olives from the deli or salad bar at your store. Stock your pantry so you always have certain foods on hand, such as olive oil, canned tuna, canned tomatoes, rice, pasta, and beans. Cooking  Cook foods with extra-virgin olive oil instead of using butter or other vegetable oils. Have meat as a side dish, and have vegetables or grains as your main dish. This means having meat in small portions or adding small amounts of meat to foods like pasta or stew. Use beans or vegetables instead of meat in common dishes like chili or lasagna. Experiment with different cooking methods. Try roasting or broiling vegetables instead of steaming or sauteing them. Add frozen vegetables to soups, stews, pasta, or rice. Add nuts or seeds for added healthy fat at each meal. You can add these to yogurt, salads, or vegetable dishes. Marinate fish or vegetables using olive oil, lemon juice, garlic, and fresh herbs. Meal planning  Plan to eat 1 vegetarian meal one day each week. Try to work up to 2 vegetarian meals, if possible. Eat seafood 2 or more times a week. Have healthy snacks readily available, such as: Vegetable sticks with hummus. Greek yogurt. Fruit and nut trail mix. Eat balanced meals throughout the week. This includes: Fruit: 2-3 servings a day Vegetables: 4-5 servings a day Low-fat dairy: 2 servings a day Fish, poultry, or lean meat: 1 serving a day Beans and legumes: 2 or more servings a week Nuts and seeds: 1-2 servings a day Whole grains: 6-8 servings a day Extra-virgin olive oil: 3-4 servings a day Limit red meat and sweets to only a few servings a month What are my food choices? Mediterranean diet Recommended Grains: Whole-grain pasta. Brown rice. Bulgar wheat. Polenta.  Couscous. Whole-wheat bread. Orpah Cobb. Vegetables: Artichokes. Beets. Broccoli. Cabbage. Carrots. Eggplant. Green beans. Chard. Kale. Spinach. Onions. Leeks. Peas. Squash. Tomatoes. Peppers. Radishes. Fruits: Apples. Apricots. Avocado. Berries. Bananas. Cherries. Dates. Figs. Grapes. Lemons. Melon. Oranges. Peaches. Plums. Pomegranate. Meats and other protein foods: Beans. Almonds. Sunflower seeds. Pine nuts. Peanuts. Cod. Salmon. Scallops. Shrimp. Tuna. Tilapia. Clams. Oysters. Eggs. Dairy: Low-fat milk. Cheese. Greek yogurt. Beverages: Water. Red wine. Herbal tea. Fats and oils: Extra virgin olive oil. Avocado oil. Grape seed oil. Sweets and desserts: [place] yogurt with honey. Baked apples. Poached pears. Trail mix. Seasoning and other foods: Basil. Cilantro. Coriander. Cumin. Mint. Parsley. Sage. Rosemary. Tarragon. Garlic. Oregano. Thyme. Pepper. Balsalmic vinegar. Tahini. Hummus. Tomato sauce. Olives. Mushrooms. Limit these Grains: Prepackaged pasta or rice dishes. Prepackaged cereal with added sugar. Vegetables: Deep fried potatoes (french fries). Fruits: Fruit canned in syrup. Meats and other protein foods: Beef. Pork. Lamb. Poultry with skin. Hot dogs. Tomasa Blase. Dairy: Ice cream. Sour cream. Whole milk. Beverages: Juice. Sugar-sweetened soft drinks. Beer. Liquor and spirits. Fats and oils: Butter. Canola oil. Vegetable oil. Beef fat (tallow). Lard. Sweets and desserts: Cookies. Cakes. Pies. Candy. Seasoning and other foods: Mayonnaise. Premade  sauces and marinades. The items listed may not be a complete list. Talk with your dietitian about what dietary choices are right for you. Summary The Mediterranean diet includes both food and lifestyle choices. Eat a variety of fresh fruits and vegetables, beans, nuts, seeds, and whole grains. Limit the amount of red meat and sweets that you eat. Talk with your health care provider about whether it is safe for you to drink red wine in  moderation. This means 1 glass a day for nonpregnant women and 2 glasses a day for men. A glass of wine equals 5 oz (150 mL). This information is not intended to replace advice given to you by your health care provider. Make sure you discuss any questions you have with your health care provider. Document Released: 10/12/2015 Document Revised: 11/14/2015 Document Reviewed: 10/12/2015 Elsevier Interactive Patient Education  2017 [employer].

## 2023-05-30 LAB — QUEST AD-DETECT™, BETA-AMYLOID 42/40 RATIO, PLASMA
ABETA 40: 318 pg/mL
ABETA 42/40 RATIO: 0.198 (ref >=0.170)
ABETA 42: 63 pg/mL

## 2023-05-30 LAB — QUEST AD-DETECT® PHOSPHORYLATED TAU181(P-TAU181), PLASMA: QUEST AD DETECT PTAU181, PLASMA: 0.83 pg/mL (ref <=1.07)

## 2023-05-30 LAB — ADMARK® APOE GENOTYPE ANALYSIS AND INTERPRETATION SYMPTOMATIC

## 2023-05-30 LAB — QUEST AD-DETECT PHOSPHORYLATED TAU217(P-TAU217), PLASMA: Quest Detect PTAU217, Plasma: 0.07 pg/mL (ref <=0.15)

## 2023-06-01 NOTE — Progress Notes (Signed)
 Good news, all of the blood tests are normal, no suspicion for Alzheimer's disease !  Thanks

## 2023-06-02 ENCOUNTER — Encounter: Payer: Self-pay | Admitting: Psychology

## 2023-06-02 DIAGNOSIS — K589 Irritable bowel syndrome without diarrhea: Secondary | ICD-10-CM | POA: Insufficient documentation

## 2023-06-02 DIAGNOSIS — M810 Age-related osteoporosis without current pathological fracture: Secondary | ICD-10-CM | POA: Insufficient documentation

## 2023-06-02 DIAGNOSIS — G4700 Insomnia, unspecified: Secondary | ICD-10-CM | POA: Insufficient documentation

## 2023-06-02 DIAGNOSIS — E7800 Pure hypercholesterolemia, unspecified: Secondary | ICD-10-CM | POA: Insufficient documentation

## 2023-06-02 DIAGNOSIS — E538 Deficiency of other specified B group vitamins: Secondary | ICD-10-CM | POA: Insufficient documentation

## 2023-06-02 DIAGNOSIS — K080 Exfoliation of teeth due to systemic causes: Secondary | ICD-10-CM | POA: Diagnosis not present

## 2023-06-02 DIAGNOSIS — E041 Nontoxic single thyroid nodule: Secondary | ICD-10-CM | POA: Insufficient documentation

## 2023-06-02 DIAGNOSIS — N390 Urinary tract infection, site not specified: Secondary | ICD-10-CM

## 2023-06-02 DIAGNOSIS — F411 Generalized anxiety disorder: Secondary | ICD-10-CM | POA: Insufficient documentation

## 2023-06-02 HISTORY — DX: Urinary tract infection, site not specified: N39.0

## 2023-06-02 NOTE — Progress Notes (Signed)
 I advised of normal biomarker with labs, patient was very thankful of the call.

## 2023-06-03 ENCOUNTER — Ambulatory Visit: Admitting: Psychology

## 2023-06-03 ENCOUNTER — Encounter: Payer: Self-pay | Admitting: Psychology

## 2023-06-03 ENCOUNTER — Ambulatory Visit: Payer: Self-pay | Admitting: Psychology

## 2023-06-03 DIAGNOSIS — R4189 Other symptoms and signs involving cognitive functions and awareness: Secondary | ICD-10-CM | POA: Diagnosis not present

## 2023-06-03 DIAGNOSIS — F4321 Adjustment disorder with depressed mood: Secondary | ICD-10-CM | POA: Diagnosis not present

## 2023-06-03 NOTE — Progress Notes (Unsigned)
 NEUROPSYCHOLOGICAL EVALUATION [place] Department of Neurology  Date of Evaluation: June 03, 2023  Reason for Referral:   Danielle Chavez is a 68 y.o. right-handed Caucasian female referred by Marlowe Kays, PA-C, to characterize her current cognitive functioning and assist with diagnostic clarity and treatment planning in the context of subjective cognitive decline.   Assessment and Plan:   Clinical Impression(s): Danielle Chavez pattern of performance is suggestive of neuropsychological functioning within normal limits relative to age-matched peers. Some performance variability was exhibited across semantic fluency where she had an isolated difficulty quickly recalling animals. However, other semantic fluency tasks were appropriate. Performances across other assessed domains were likewise appropriate. This includes processing speed, attention/concentration, executive functioning, receptive language, phonemic fluency, confrontation naming, visuospatial abilities, and all aspects of learning and memory. Danielle Chavez denied difficulties completing instrumental activities of daily living (ADLs) independently. She does not warrant consideration for a neurocognitive disorder presently.   The cause for some weakness/variability surrounding semantic fluency is uncertain. This could reflect a longstanding weakness. It could also simply reflect the effects of acute testing anxiety, more longstanding psychiatric distress and other life stressors, some sleep disturbances, and chronic medical conditions. Specific to memory, Danielle Chavez was able to learn novel verbal and visual information efficiently and retain this knowledge after lengthy delays. Overall, memory performance combined with intact performances across other areas of cognitive functioning is not suggestive of presently symptomatic Alzheimer's disease. Likewise, her cognitive and behavioral profile  is not suggestive of any other form of neurodegenerative illness presently.  Recommendations: A repeat neuropsychological evaluation in 24-36 months (or sooner if functional decline is noted) is recommended to assess the trajectory of future cognitive decline should it occur.  A combination of medication and psychotherapy has been shown to be most effective at treating symptoms of anxiety and depression. As such, Danielle Chavez is encouraged to speak with her prescribing physician regarding medication adjustments to optimally manage these symptoms. Likewise, Danielle Chavez is encouraged to consider engaging in short-term psychotherapy to address symptoms of psychiatric distress. She would benefit from an active and collaborative therapeutic environment, rather than one purely supportive in nature. Recommended treatment modalities include Cognitive Behavioral Therapy (CBT) or Acceptance and Commitment Therapy (ACT).  Danielle Chavez is encouraged to attend to lifestyle factors for brain health (e.g., regular physical exercise, good nutrition habits and consideration of the MIND-DASH diet, regular participation in cognitively-stimulating activities, and general stress management techniques), which are likely to have benefits for both emotional adjustment and cognition. In fact, in addition to promoting good general health, regular exercise incorporating aerobic activities (e.g., brisk walking, jogging, cycling, etc.) has been demonstrated to be a very effective treatment for depression and stress, with similar efficacy rates to both antidepressant medication and psychotherapy. Optimal control of vascular risk factors (including safe cardiovascular exercise and adherence to dietary recommendations) is encouraged. Continued participation in activities which provide mental stimulation and social interaction is also recommended.   If interested, there are some activities which have therapeutic value and can be useful  in keeping her cognitively stimulated. For suggestions, Danielle Chavez is encouraged to go to the following website: https://www.[facility].org/get-to-know-barrow/centers-programs/neurorehabilitation-center/neuro-rehab-apps-and-games/ which has options, categorized by level of difficulty. It should be noted that these activities should not be viewed as a substitute for therapy.  Memory can be improved using internal strategies such as rehearsal, repetition, chunking, mnemonics, association, and imagery. External strategies such as written notes in a consistently used memory journal, visual and nonverbal  auditory cues such as a calendar on the refrigerator or appointments with alarm, such as on a cell phone, can also help maximize recall.    Review of Records:   Danielle Chavez was seen by [facility] Marlowe Kays, PA-C) on 03/03/2023 for an evaluation of memory loss. At that time, she primarily described trouble recalling names of familiar individuals. Difficulties were said to be present for the past year or so. ADL dysfunction was denied. She did highlight some family stressors. Performance on a brief cognitive screening instrument (MOCA) was 28/30. Ultimately, Danielle Chavez was referred for a comprehensive neuropsychological evaluation to characterize her cognitive abilities and to assist with diagnostic clarity and treatment planning.   Neuroimaging: Brain MRI on 04/08/2023 suggested mild generalized cerebral atrophy and minimal microvascular ischemic disease. Blood plasma analysis on 05/22/2023 did not suggest increased risk of Alzheimer's disease based upon beta-amyloid 42/40 ratio, pTau181 levels, or pTau217 levels. Genetic testing suggested an APOE 3/3 genotype.  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 Upper Endoscopy ? 2009: negative   Generalized anxiety disorder    History of appendectomy 05/04/2019   Hypercholesterolemia    Insomnia    Irritable bowel syndrome    Mitral valve disease 12/23/2007   Clinical diagnosis (no ECHO)   Osteopenia    Osteoporosis    Personal history of radiation therapy 2010   Situational depression 02/10/2007   Symptoms began as  depression related to family issues & breast cancer in 2010.Controlled with Citalopram 20 mg  Family history  positive for  depression in mother & father; alcohol abuse in MG uncle ; drug abuse in son ; bipolar disorder in M aunt   Thyroid nodule    Vitamin B12 deficiency     Past Surgical History:  Procedure Laterality Date   APPENDECTOMY     BREAST BIOPSY Left 07/08/2008   BREAST LUMPECTOMY Left 08/29/2008   Radiation   FOOT SURGERY     KNEE CARTILAGE SURGERY     age15   REDUCTION MAMMAPLASTY Right 08/29/2008    Current Outpatient Medications:    cetirizine (ZYRTEC) 10 MG tablet, Take 1 tablet (10 mg total) daily by mouth., Disp: 15 tablet, Rfl: 0   citalopram (CELEXA) 20 MG tablet, Take 20 mg by mouth daily. (Patient not taking: Reported on 03/03/2023), Disp: , Rfl: 3   dexlansoprazole (DEXILANT) 60 MG capsule, Take 1 capsule (60 mg total) by mouth daily. (Patient not taking: Reported on 03/03/2023), Disp: 30 capsule, Rfl: 6   fluticasone (FLONASE) 50 MCG/ACT nasal spray, Place 2 sprays daily into both nostrils., Disp: 1 g, Rfl: 0   Multiple Vitamin (MULTIVITAMIN) capsule, Take 1 capsule by mouth  daily.  , Disp: , Rfl:    pantoprazole (PROTONIX) 40 MG tablet, Take 1 tablet (40 mg total) by mouth 2 (two) times daily., Disp:  60 tablet, Rfl: 6   pantoprazole (PROTONIX) 40 MG tablet, Take 1 tablet (40 mg total) by mouth daily. Yearly physical is due must see MD for refills (Patient taking differently: Take 20 mg by mouth daily. Yearly physical is due must see MD for refills), Disp: 30 tablet, Rfl: 0   traZODone (DESYREL) 50 MG tablet, Take 50 mg by mouth as needed. (Patient not taking: Reported on 05/22/2023), Disp: , Rfl: 2  Clinical Interview:   The following information was obtained during a clinical interview with Danielle Chavez prior to cognitive testing.  Cognitive Symptoms: Decreased short-term memory: Endorsed. She described primary difficulties recalling names of familiar individuals, as well as street names when providing directions. Outside of this, no other memory concerns were noted. Difficulties were said to be stable over the past 6-12 months.  Decreased long-term memory: Denied. Decreased attention/concentration: Denied. Reduced processing speed: Denied. Difficulties with executive functions: Denied. Difficulties with emotion regulation: Denied. Difficulties with receptive language: Denied. Difficulties with word finding: Denied. Decreased visuoperceptual ability: Denied.  Difficulties completing ADLs: Denied.  Additional Medical History: History of traumatic brain injury/concussion: Denied. History of stroke: Denied. History of seizure activity: Denied. History of known exposure to toxins: Denied. Symptoms of chronic pain: Denied. Experience of frequent headaches/migraines: Denied. Frequent instances of dizziness/vertigo: Denied.  Sensory changes: She wears contact lenses with benefit. Other sensory changes/difficulties (e.g., hearing, taste, smell) were denied.  Balance/coordination difficulties: Denied. She also denied any recent falls. Other motor difficulties:  Denied.  Sleep History: Estimated hours obtained each night: Unclear/variable.  Difficulties falling asleep: Endorsed "occasionally" Difficulties staying asleep: Endorsed "occasionally." Feels rested and refreshed upon awakening: Endorsed "usually."  History of snoring: Denied. History of waking up gasping for air: Denied. Witnessed breath cessation while asleep: Denied.  History of vivid dreaming: Denied. Excessive movement while asleep: Denied. Instances of acting out her dreams: Denied.  Psychiatric/Behavioral Health History: Depression: She described bouts of situational depression and stress surrounding mental health concerns involving her son. She described her current mood as "good." She noted utilizing psychotherapy in the past with positive benefit and reported currently being on a wait list to resume services after her previous therapist retired. Current or remote suicidal ideation, intent, or plan was denied.  Anxiety: Denied. Mania: Denied. Trauma History: Denied. Visual/auditory hallucinations: Denied. Delusional thoughts: Denied.  Tobacco: Denied. Alcohol: She reported consuming approximately 5-6 alcoholic beverages weekly and denied a history of problematic alcohol abuse or dependence.  Recreational drugs: Denied.  Family History: Problem Relation Age of Onset   Asthma Mother    Osteoarthritis Mother        Osteoporosis   Ulcers Mother    Hypertension Mother    Depression Mother    Alzheimer's disease Mother    Hypertension Father    Ulcers Father    Other Father        AIDS   Depression Father    Breast cancer Sister 4   Stroke Maternal Grandmother        >65   Breast cancer Maternal Grandmother 49   Drug abuse Son    Bipolar disorder Maternal Aunt    Alcohol abuse Other        MG uncle   This information was confirmed by Danielle Chavez.  Academic/Vocational History: Highest level of educational attainment: 16 years. She graduated from high school  and earned a Oncologist in psychology. She described herself as a good (A/B) student in later academic settings. Math was noted as a relative  weakness.  History of developmental delay: Denied. History of grade repetition: Denied. Enrollment in special education courses: Denied. History of LD/ADHD: Denied.  Employment: Retired. She previously worked as an Psychologist, educational.   Evaluation Results:   Behavioral Observations: Danielle Chavez was unaccompanied, arrived to her appointment on time, and was appropriately dressed and groomed. She appeared alert. Observed gait and station were within normal limits. Gross motor functioning appeared intact upon informal observation and no abnormal movements (e.g., tremors) were noted. Her affect was generally relaxed and positive. Spontaneous speech was fluent and word finding difficulties were not observed during the clinical interview. Thought processes were coherent, organized, and normal in content. Insight into her cognitive difficulties appeared adequate.   During testing, sustained attention was appropriate. Task engagement was adequate and she persisted when challenged. Overall, Danielle Chavez was cooperative with the clinical interview and subsequent testing procedures.   Adequacy of Effort: The validity of neuropsychological testing is limited by the extent to which the individual being tested may be assumed to have exerted adequate effort during testing. Danielle Chavez expressed her intention to perform to the best of her abilities and exhibited adequate task engagement and persistence. Scores across stand-alone and embedded performance validity measures were within expectation. As such, the results of the current evaluation are believed to be a valid representation of Danielle Chavez current cognitive functioning.  Test Results: Danielle Chavez was fully oriented at the time of the current evaluation.  Intellectual abilities based upon  educational and vocational attainment were estimated to be in the average range. Premorbid abilities were estimated to be within the above average range based upon a single-word reading test. Her full scale IQ calculation was in the average range.   Processing speed was below average to average. Basic attention was below average. More complex attention (e.g., working memory) was average. Executive functioning was below average to average.  While not directly assessed, receptive language abilities were believed to be intact. Likewise, Danielle Chavez did not exhibit any difficulties comprehending task instructions and answered all questions asked of her appropriately. Assessed expressive language was somewhat variable. Phonemic fluency was below average to average, semantic fluency was well below average to average, and confrontation naming was average.   Assessed visuospatial/visuoconstructional abilities were average to above average. Points were lost on her drawing of a clock due to incorrect hand placement.   Learning (i.e., encoding) of novel verbal and visual information was average to above average. Spontaneous delayed recall (i.e., retrieval) of previously learned information was also average to above average. Retention rates were 100% across a list learning task, 94% across a story learning task, and 86% across a shape learning task. Performance across recognition tasks was above average, suggesting evidence for information consolidation.   Results of emotional screening instruments suggested that recent symptoms of generalized anxiety were in the moderate range, while symptoms of depression were within normal limits. She did not elevate any clinical subscales across a more comprehensive psychiatric questionnaire. A screening instrument assessing recent sleep quality suggested the presence of minimal sleep dysfunction.  Tables of Scores:   Note: This summary of test scores accompanies the  interpretive report and should not be considered in isolation without reference to the appropriate sections in the text. Descriptors are based on appropriate normative data and may be adjusted based on clinical judgment. Terms such as "Within Normal Limits" and "Outside Normal Limits" are used when a more specific description of the test score cannot be determined.  Percentile - Normative Descriptor > 98 - Exceptionally High 91-97 - Well Above Average 75-90 - Above Average 25-74 - Average 9-24 - Below Average 2-8 - Well Below Average < 2 - Exceptionally Low       Validity:   DESCRIPTOR       ACS WC: --- --- Within Normal Limits  DCT: --- --- Within Normal Limits       Orientation:      Raw Score Percentile   NAB Orientation, Form 1 29/29 --- ---       Cognitive Screening:      Raw Score Percentile   SLUMS: 25/30 --- ---       Intellectual Functioning:      Standard Score Percentile   Test of Premorbid Functioning: 112 79 Above Average       Wechsler Adult Intelligence Scale (WAIS-5):  Standard Score/ Scaled Score Percentile   Full Scale IQ  90 25 Average  General Abilities Index 92 30 Average    Similarities  9 37 Average    Vocabulary 9 37 Average    Block Design  10 50 Average    Matrix Reasoning 8 25 Average    Figure Weights 8 25 Average    Digit Sequencing 9 37 Average    Coding 7 16 Below Average       Memory:     NAB Memory Module, Form 1: Standard Score/ T Score Percentile   Total Memory Index 102 55 Average  List Learning       Total Trials 1-3 25/36 (49) 46 Average    Short Delay Free Recall 8/12 (49) 46 Average    Long Delay Free Recall 8/12 (50) 50 Average    Retention Percentage 100 (51) 54 Average    Recognition Discriminability 10 (57) 75 Above Average  Shape Learning       Total Trials 1-3 15/27 (46) 34 Average    Delayed Recall 6/9 (50) 50 Average    Retention Percentage 86 (46) 34 Average    Recognition Discriminability 8 (57) 75 Above  Average  Story Learning       Immediate Recall 66/80 (50) 50 Average    Delayed Recall 34/40 (48) 42 Average    Retention Percentage 94 (53) 62 Average  Daily Living Memory       Immediate Recall 48/51 (60) 84 Above Average    Delayed Recall 17/17 (60) 84 Above Average    Retention Percentage 100 (59) 82 Above Average    Recognition Hits 10/10 (60) 84 Above Average       Attention/Executive Function:     Trail Making Test (TMT): Raw Score (T Score) Percentile     Part A 35 secs.,  0 errors (46) 34 Average    Part B 105 secs.,  0 errors (38) 12 Below Average         Scaled Score Percentile   WAIS-5 Coding: 7 16 Below Average  WAIS-5 Naming Speed Quantity: 9 37 Average        Scaled Score Percentile   WAIS-5 Digits Forwards: 6 9 Below Average  WAIS-5 Digit Sequencing: 9 37 Average        Scaled Score Percentile   WAIS-5 Similarities: 9 37 Average  WAIS-5 Matrix Reasoning: 8 25 Average  WAIS-5 Figure Weights: 8 25 Average       D-KEFS Verbal Fluency Test: Raw Score (Scaled Score) Percentile     Letter Total Correct 34 (9) 37 Average    Category  Total Correct 29 (8) 25 Average    Category Switching Total Correct 11 (8) 25 Average    Category Switching Accuracy 9 (8) 25 Average      Total Set Loss Errors 1 (11) 63 Average      Total Repetition Errors 2 (11) 63 Average       Language:     Verbal Fluency Test: Raw Score (T Score) Percentile     Phonemic Fluency (FAS) 34 (42) 21 Below Average    Animal Fluency 14 (35) 7 Well Below Average        NAB Language Module, Form 1: T Score Percentile     Naming 30/31 (55) 69 Average       Visuospatial/Visuoconstruction:      Raw Score Percentile   Clock Drawing: 8/10 --- Within Normal Limits       NAB Spatial Module, Form 1: T Score Percentile     Figure Drawing Copy 60 84 Above Average        Scaled Score Percentile   WAIS-5 Block Design: 10 50 Average       Mood and Personality:      Raw Score Percentile   Beck  Depression Inventory - II: 9 --- Within Normal Limits  PROMIS Anxiety Questionnaire: 20 --- Moderate       Personality Assessment Inventory: T Score  Percentile     Inconsistency 58 --- Within Normal Limits    Infrequency 47 --- Within Normal Limits    Negative Impression 47 --- Within Normal Limits    Positive Impression 50 --- Within Normal Limits    Somatic Complaints 41 --- Within Normal Limits    Anxiety 53 --- Within Normal Limits    Anxiety-Related Disorders 48 --- Within Normal Limits    Depression 51 --- Within Normal Limits    Mania 41 --- Within Normal Limits    Paranoia 49 --- Within Normal Limits    Schizophrenia 45 --- Within Normal Limits    Borderline Features 49 --- Within Normal Limits    Antisocial Features 44 --- Within Normal Limits    Alcohol Problems 50 --- Within Normal Limits    Drug Problems 52 --- Within Normal Limits    Aggression 54 --- Within Normal Limits    Suicidal Ideation 49 --- Within Normal Limits    Stress 41 --- Within Normal Limits    Non Support 39 --- Within Normal Limits    Treatment Rejection 61 --- Within Normal Limits    Dominance 49 --- Within Normal Limits    Warmth 58 --- Within Normal Limits       Additional Questionnaires:      Raw Score Percentile   PROMIS Sleep Disturbance Questionnaire: 23 --- None to Slight   Informed Consent and Coding/Compliance:   The current evaluation represents a clinical evaluation for the purposes previously outlined by the referral source and is in no way reflective of a forensic evaluation.   Danielle Chavez was provided with a verbal description of the nature and purpose of the present neuropsychological evaluation. Also reviewed were the foreseeable risks and/or discomforts and benefits of the procedure, limits of confidentiality, and mandatory reporting requirements of this provider. The patient was given the opportunity to ask questions and receive answers about the evaluation. Oral consent to  participate was provided by the patient.   This evaluation was conducted by Newman Nickels, Ph.D., ABPP-CN, board certified clinical neuropsychologist. Ms. Burkard completed a clinical interview with Dr. Milbert Coulter, billed  as one unit [id number], and 185 minutes of cognitive testing and scoring, billed as one unit [phone] and five additional units [id number]. Psychometrist Wallace Keller, B.S. assisted Dr. Milbert Coulter with test administration and scoring procedures. As a separate and discrete service, one unit [id number] and two units [phone] were billed for Dr. Tammy Sours time spent in interpretation and report writing.

## 2023-06-03 NOTE — Progress Notes (Signed)
   Psychometrician Note   Cognitive testing was administered to Danielle Chavez by Wallace Keller, B.S. (psychometrist) under the supervision of Dr. Newman Nickels, Ph.D., ABPP, licensed psychologist on 06/03/2023. Danielle Chavez did not appear overtly distressed by the testing session per behavioral observation or responses across self-report questionnaires. Rest breaks were offered.    The battery of tests administered was selected by Dr. Newman Nickels, Ph.D., ABPP with consideration to Danielle Chavez's current level of functioning, the nature of her symptoms, emotional and behavioral responses during interview, level of literacy, observed level of motivation/effort, and the nature of the referral question. This battery was communicated to the psychometrist. Communication between Dr. Newman Nickels, Ph.D., ABPP and the psychometrist was ongoing throughout the evaluation and Dr. Newman Nickels, Ph.D., ABPP was immediately accessible at all times. Dr. Newman Nickels, Ph.D., ABPP provided supervision to the psychometrist on the date of this service to the extent necessary to assure the quality of all services provided.    Danielle Chavez will return within approximately 1-2 weeks for an interactive feedback session with Dr. Milbert Coulter at which time her test performances, clinical impressions, and treatment recommendations will be reviewed in detail. Danielle Chavez understands she can contact our office should she require our assistance before this time.  A total of 185 minutes of billable time were spent face-to-face with Danielle Chavez by the psychometrist. This includes both test administration and scoring time. Billing for these services is reflected in the clinical report generated by Dr. Newman Nickels, Ph.D., ABPP  This note reflects time spent with the psychometrician and does not include test scores or any clinical interpretations made by Dr. Milbert Coulter. The full report will  follow in a separate note.

## 2023-06-11 ENCOUNTER — Ambulatory Visit: Admitting: Psychology

## 2023-06-11 DIAGNOSIS — F4321 Adjustment disorder with depressed mood: Secondary | ICD-10-CM

## 2023-06-11 DIAGNOSIS — R4189 Other symptoms and signs involving cognitive functions and awareness: Secondary | ICD-10-CM | POA: Diagnosis not present

## 2023-06-11 NOTE — Progress Notes (Signed)
   Neuropsychology Feedback Session Eligha Bridegroom. [facility] Department of Neurology  Reason for Referral:   Kayal Mula is a 68 y.o. right-handed Caucasian female referred by Marlowe Kays, PA-C, to characterize her current cognitive functioning and assist with diagnostic clarity and treatment planning in the context of subjective cognitive decline.   Feedback:   Ms. Lagoy completed a comprehensive neuropsychological evaluation on 06/03/2023. Please refer to that encounter for the full report and recommendations. Briefly, results suggested neuropsychological functioning within normal limits relative to age-matched peers. Some performance variability was exhibited across semantic fluency where she had an isolated difficulty quickly recalling animals. However, other semantic fluency tasks were appropriate. Performances across other assessed domains were likewise appropriate. This includes processing speed, attention/concentration, executive functioning, receptive language, phonemic fluency, confrontation naming, visuospatial abilities, and all aspects of learning and memory. Ms. Mastrogiovanni denied difficulties completing instrumental activities of daily living (ADLs) independently. She does not warrant consideration for a neurocognitive disorder presently.   Ms. Sparr was unaccompanied during the current feedback session. Content of the current session focused on the results of her neuropsychological evaluation. Ms. Bermea was given the opportunity to ask questions and her questions were answered. She was encouraged to reach out should additional questions arise. A copy of her report was provided at the conclusion of the visit.      One unit [phone] was billed for Dr. Tammy Sours time spent preparing for, conducting, and documenting the current feedback session with Ms. Swiney.

## 2023-06-19 DIAGNOSIS — K080 Exfoliation of teeth due to systemic causes: Secondary | ICD-10-CM | POA: Diagnosis not present

## 2023-06-27 ENCOUNTER — Encounter: Payer: Self-pay | Admitting: Physician Assistant

## 2023-07-29 DIAGNOSIS — K080 Exfoliation of teeth due to systemic causes: Secondary | ICD-10-CM | POA: Diagnosis not present

## 2023-08-26 DIAGNOSIS — D2262 Melanocytic nevi of left upper limb, including shoulder: Secondary | ICD-10-CM | POA: Diagnosis not present

## 2023-08-26 DIAGNOSIS — L820 Inflamed seborrheic keratosis: Secondary | ICD-10-CM | POA: Diagnosis not present

## 2023-08-26 DIAGNOSIS — L821 Other seborrheic keratosis: Secondary | ICD-10-CM | POA: Diagnosis not present

## 2023-08-26 DIAGNOSIS — L720 Epidermal cyst: Secondary | ICD-10-CM | POA: Diagnosis not present

## 2023-09-22 ENCOUNTER — Ambulatory Visit: Admitting: Physician Assistant

## 2023-10-24 ENCOUNTER — Ambulatory Visit: Admitting: Physician Assistant

## 2023-10-28 DIAGNOSIS — H5203 Hypermetropia, bilateral: Secondary | ICD-10-CM | POA: Diagnosis not present

## 2023-10-29 DIAGNOSIS — Z124 Encounter for screening for malignant neoplasm of cervix: Secondary | ICD-10-CM | POA: Diagnosis not present

## 2023-10-29 DIAGNOSIS — Z01419 Encounter for gynecological examination (general) (routine) without abnormal findings: Secondary | ICD-10-CM | POA: Diagnosis not present

## 2023-10-29 DIAGNOSIS — Z681 Body mass index (BMI) 19 or less, adult: Secondary | ICD-10-CM | POA: Diagnosis not present

## 2023-12-25 ENCOUNTER — Other Ambulatory Visit: Payer: Self-pay | Admitting: Obstetrics and Gynecology

## 2023-12-25 DIAGNOSIS — Z1231 Encounter for screening mammogram for malignant neoplasm of breast: Secondary | ICD-10-CM

## 2024-01-19 ENCOUNTER — Ambulatory Visit
Admission: RE | Admit: 2024-01-19 | Discharge: 2024-01-19 | Disposition: A | Discharge status: Home / Self Care | Source: Ambulatory Visit | Attending: Obstetrics and Gynecology | Admitting: Obstetrics and Gynecology

## 2024-01-19 DIAGNOSIS — Z1231 Encounter for screening mammogram for malignant neoplasm of breast: Secondary | ICD-10-CM | POA: Diagnosis not present

## 2024-01-23 DIAGNOSIS — M810 Age-related osteoporosis without current pathological fracture: Secondary | ICD-10-CM | POA: Diagnosis not present

## 2024-01-23 DIAGNOSIS — K219 Gastro-esophageal reflux disease without esophagitis: Secondary | ICD-10-CM | POA: Diagnosis not present

## 2024-01-23 DIAGNOSIS — E7800 Pure hypercholesterolemia, unspecified: Secondary | ICD-10-CM | POA: Diagnosis not present

## 2024-01-23 DIAGNOSIS — Z0000 Encounter for general adult medical examination without abnormal findings: Secondary | ICD-10-CM | POA: Diagnosis not present

## 2024-01-23 DIAGNOSIS — E041 Nontoxic single thyroid nodule: Secondary | ICD-10-CM | POA: Diagnosis not present

## 2024-01-23 DIAGNOSIS — E538 Deficiency of other specified B group vitamins: Secondary | ICD-10-CM | POA: Diagnosis not present

## 2024-01-23 DIAGNOSIS — Z23 Encounter for immunization: Secondary | ICD-10-CM | POA: Diagnosis not present

## 2024-01-23 DIAGNOSIS — Z1331 Encounter for screening for depression: Secondary | ICD-10-CM | POA: Diagnosis not present

## 2024-01-23 DIAGNOSIS — F39 Unspecified mood [affective] disorder: Secondary | ICD-10-CM | POA: Diagnosis not present

## 2024-02-05 DIAGNOSIS — H903 Sensorineural hearing loss, bilateral: Secondary | ICD-10-CM | POA: Diagnosis not present

## 2024-02-05 DIAGNOSIS — J342 Deviated nasal septum: Secondary | ICD-10-CM | POA: Diagnosis not present

## 2024-02-18 ENCOUNTER — Institutional Professional Consult (permissible substitution): Payer: Medicare Other | Admitting: Psychology

## 2024-02-18 ENCOUNTER — Ambulatory Visit: Payer: Self-pay

## 2024-02-24 ENCOUNTER — Encounter: Payer: Medicare Other | Admitting: Psychology
